# Patient Record
Sex: Female | Born: 1960 | Race: White | Hispanic: No | Marital: Married | Smoking: Former smoker
Health system: Southern US, Community
[De-identification: ages and names within clinical notes are randomized; demographics above are authoritative.]

## PROBLEM LIST (undated history)

## (undated) DIAGNOSIS — C801 Malignant (primary) neoplasm, unspecified: Secondary | ICD-10-CM

## (undated) DIAGNOSIS — F329 Major depressive disorder, single episode, unspecified: Secondary | ICD-10-CM

## (undated) DIAGNOSIS — K219 Gastro-esophageal reflux disease without esophagitis: Secondary | ICD-10-CM

## (undated) DIAGNOSIS — R030 Elevated blood-pressure reading, without diagnosis of hypertension: Secondary | ICD-10-CM

## (undated) DIAGNOSIS — I82409 Acute embolism and thrombosis of unspecified deep veins of unspecified lower extremity: Secondary | ICD-10-CM

## (undated) DIAGNOSIS — F32A Depression, unspecified: Secondary | ICD-10-CM

## (undated) DIAGNOSIS — I2699 Other pulmonary embolism without acute cor pulmonale: Secondary | ICD-10-CM

## (undated) DIAGNOSIS — Z8614 Personal history of Methicillin resistant Staphylococcus aureus infection: Secondary | ICD-10-CM

## (undated) DIAGNOSIS — C569 Malignant neoplasm of unspecified ovary: Secondary | ICD-10-CM

## (undated) DIAGNOSIS — F419 Anxiety disorder, unspecified: Secondary | ICD-10-CM

## (undated) HISTORY — DX: Acute embolism and thrombosis of unspecified deep veins of unspecified lower extremity: I82.409

## (undated) HISTORY — DX: Depression, unspecified: F32.A

## (undated) HISTORY — DX: Other pulmonary embolism without acute cor pulmonale: I26.99

## (undated) HISTORY — DX: Elevated blood-pressure reading, without diagnosis of hypertension: R03.0

## (undated) HISTORY — PX: TONSILECTOMY/ADENOIDECTOMY WITH MYRINGOTOMY: SHX6125

## (undated) HISTORY — PX: CHOLECYSTECTOMY: SHX55

## (undated) HISTORY — DX: Malignant neoplasm of unspecified ovary: C56.9

## (undated) HISTORY — DX: Major depressive disorder, single episode, unspecified: F32.9

## (undated) HISTORY — DX: Anxiety disorder, unspecified: F41.9

---

## 2006-12-18 ENCOUNTER — Inpatient Hospital Stay: Payer: Self-pay | Admitting: Internal Medicine

## 2010-04-12 DIAGNOSIS — Z8614 Personal history of Methicillin resistant Staphylococcus aureus infection: Secondary | ICD-10-CM

## 2010-04-12 HISTORY — DX: Personal history of Methicillin resistant Staphylococcus aureus infection: Z86.14

## 2014-08-09 ENCOUNTER — Ambulatory Visit
Admit: 2014-08-09 | Disposition: A | Discharge status: Home / Self Care | Payer: Self-pay | Attending: Advanced Practice Midwife | Admitting: Advanced Practice Midwife

## 2016-01-30 DIAGNOSIS — F419 Anxiety disorder, unspecified: Secondary | ICD-10-CM | POA: Diagnosis not present

## 2016-01-30 DIAGNOSIS — N898 Other specified noninflammatory disorders of vagina: Secondary | ICD-10-CM | POA: Diagnosis not present

## 2016-01-30 DIAGNOSIS — F329 Major depressive disorder, single episode, unspecified: Secondary | ICD-10-CM | POA: Diagnosis not present

## 2016-01-30 DIAGNOSIS — Z23 Encounter for immunization: Secondary | ICD-10-CM | POA: Diagnosis not present

## 2017-03-01 ENCOUNTER — Ambulatory Visit: Payer: Self-pay | Admitting: Obstetrics and Gynecology

## 2017-03-08 ENCOUNTER — Encounter: Payer: Self-pay | Admitting: Obstetrics and Gynecology

## 2017-03-08 ENCOUNTER — Ambulatory Visit (INDEPENDENT_AMBULATORY_CARE_PROVIDER_SITE_OTHER): Payer: Federal, State, Local not specified - PPO | Admitting: Obstetrics and Gynecology

## 2017-03-08 ENCOUNTER — Other Ambulatory Visit: Payer: Self-pay

## 2017-03-08 VITALS — BP 154/100 | HR 100 | Ht 64.0 in | Wt 252.0 lb

## 2017-03-08 DIAGNOSIS — R030 Elevated blood-pressure reading, without diagnosis of hypertension: Secondary | ICD-10-CM

## 2017-03-08 DIAGNOSIS — Z23 Encounter for immunization: Secondary | ICD-10-CM

## 2017-03-08 DIAGNOSIS — R635 Abnormal weight gain: Secondary | ICD-10-CM

## 2017-03-08 DIAGNOSIS — F32A Depression, unspecified: Secondary | ICD-10-CM

## 2017-03-08 DIAGNOSIS — F419 Anxiety disorder, unspecified: Secondary | ICD-10-CM

## 2017-03-08 DIAGNOSIS — F329 Major depressive disorder, single episode, unspecified: Secondary | ICD-10-CM

## 2017-03-08 MED ORDER — SERTRALINE HCL 100 MG PO TABS: 150.0000 mg | ORAL_TABLET | Freq: Every day | ORAL | 0 refills | Status: DC

## 2017-03-08 MED ORDER — ALPRAZOLAM 0.5 MG PO TABS: 0.5000 mg | ORAL_TABLET | Freq: Every evening | ORAL | 0 refills | Status: DC | PRN

## 2017-03-08 NOTE — Progress Notes (Signed)
Chief Complaint  Patient presents with  . Follow-up    Zoloft Refill    HPI:      [name] is a 56 y.o. No obstetric history on file. who LMP was No LMP recorded (lmp unknown). Patient is not currently having periods (Reason: Perimenopausal)., presents today for Rx RF on zoloft for anxiety/depression. Pt had been followed by [facility], CNM. She has a long hx of off and on zoloft use for sx. Pt is taking zoloft 100 mg daily and xanax very rarely. She has a lot of family stressors and thinks the zoloft is helping with sx but not controlling them. She is irritable with a lot of crying and wanting to withdraw. No side effects of Rx. She has not seen a therapist. She is upset about her weight although admits to being a compulsive eater for emotional support. She does not exercise. She had been 128# at one point. She does have occas vasomotor sx and wonders how much of her sx are menopause related. She just wants to feel "herself" again.  She is terrified of new medications.    Past Medical History:  Diagnosis Date  . Anxiety and depression   . White coat syndrome without hypertension     Past Surgical History:  Procedure Laterality Date  . CESAREAN SECTION  12/87;8/92;10/97  . CHOLECYSTECTOMY    . TONSILECTOMY/ADENOIDECTOMY WITH MYRINGOTOMY      History reviewed. No pertinent family history.  Social History   Socioeconomic History  . Marital status: Married    Spouse name: Not on file  . Number of children: Not on file  . Years of education: Not on file  . Highest education level: Not on file  Social Needs  . Financial resource strain: Not on file  . Food insecurity - worry: Not on file  . Food insecurity - inability: Not on file  . Transportation needs - medical: Not on file  . Transportation needs - non-medical: Not on file  Occupational History  . Not on file  Tobacco Use  . Smoking status: Former Smoker    Packs/day: 0.50    Years: 18.00    Pack  years: 9.00    Types: Cigarettes  . Smokeless tobacco: Never Used  Substance and Sexual Activity  . Alcohol use: No    Frequency: Never  . Drug use: No  . Sexual activity: Not Currently  Other Topics Concern  . Not on file  Social History Narrative  . Not on file     Current Outpatient Medications:  .  ALPRAZolam (XANAX) 0.5 MG tablet, Take 1 tablet (0.5 mg total) by mouth at bedtime as needed for anxiety., Disp: 30 tablet, Rfl: 0 .  sertraline (ZOLOFT) 100 MG tablet, Take 1.5 tablets (150 mg total) by mouth daily., Disp: 135 tablet, Rfl: 0   ROS:  Review of Systems  Constitutional: Negative for fever.  Gastrointestinal: Negative for blood in stool, constipation, diarrhea, nausea and vomiting.  Genitourinary: Negative for dyspareunia, dysuria, flank pain, frequency, hematuria, urgency, vaginal bleeding, vaginal discharge and vaginal pain.  Musculoskeletal: Negative for back pain.  Skin: Negative for rash.  Psychiatric/Behavioral: Positive for agitation and dysphoric mood. Negative for self-injury and suicidal ideas. The patient is nervous/anxious.      OBJECTIVE:   Vitals:  BP (!) 154/100 (BP Location: Right Arm, Patient Position: Sitting, Cuff Size: Large)   Pulse 100   Ht 5\' 4"  (1.626 m)   Wt 252 lb (114.3 kg)  LMP  (LMP Unknown)   BMI 43.26 kg/m   Physical Exam  Constitutional: She is oriented to person, place, and time and well-developed, well-nourished, and in no distress.  Pulmonary/Chest: Effort normal.  Neurological: She is alert and oriented to person, place, and time.  Psychiatric: Memory, affect and judgment normal.  Vitals reviewed.   Results: GAD-7=15 (WAS 3 IN 10/17) PHQ-9=17 (WAS 8 IN 10/17)  Assessment/Plan: Anxiety and depression - Sx not controlled with zoloft 100 mg. Will increase to 150 mg (1 1/2 tabs daily). Strongly encouraged pt to see therapist. Names given. Sx most likely from past - Plan: sertraline (ZOLOFT) 100 MG tablet, ALPRAZolam  (XANAX) 0.5 MG tablet  Elevated blood pressure reading in office with white coat syndrome, without diagnosis of hypertension - Pt's daughter to rechk at home. May be early HTN. Rechk at 6 wk f/u appt too.  Weight gain - Discussed diet/exercise changes but if pt is compulsively eating, we need to deal with that first. Add exercise/see therapist first. Pt declines wt loss meds.  Need for immunization against influenza - Plan: Flu Vaccine QUAD 36+ mos IM    Meds ordered this encounter  Medications  . sertraline (ZOLOFT) 100 MG tablet    Sig: Take 1.5 tablets (150 mg total) by mouth daily.    Dispense:  135 tablet    Refill:  0  . ALPRAZolam (XANAX) 0.5 MG tablet    Sig: Take 1 tablet (0.5 mg total) by mouth at bedtime as needed for anxiety.    Dispense:  30 tablet    Refill:  0      Return in about 6 weeks (around 04/19/2017) for anxiety/depression f/u.  Alicia B. Copland, PA-C 03/09/2017 9:27 AM

## 2017-03-08 NOTE — Patient Instructions (Signed)
I value your feedback and entrusting us with your care. If you get a [facility] patient survey, I would appreciate you taking the time to let us know about your experience today. Thank you! 

## 2017-03-09 ENCOUNTER — Encounter: Payer: Self-pay | Admitting: Obstetrics and Gynecology

## 2017-04-19 ENCOUNTER — Ambulatory Visit: Payer: Federal, State, Local not specified - PPO | Admitting: Obstetrics and Gynecology

## 2017-05-09 ENCOUNTER — Ambulatory Visit: Payer: Federal, State, Local not specified - PPO | Admitting: Obstetrics and Gynecology

## 2017-05-17 ENCOUNTER — Ambulatory Visit (INDEPENDENT_AMBULATORY_CARE_PROVIDER_SITE_OTHER): Payer: Federal, State, Local not specified - PPO | Admitting: Obstetrics and Gynecology

## 2017-05-17 ENCOUNTER — Encounter: Payer: Self-pay | Admitting: Obstetrics and Gynecology

## 2017-05-17 VITALS — BP 150/98 | HR 96 | Ht 64.0 in | Wt 254.0 lb

## 2017-05-17 DIAGNOSIS — F419 Anxiety disorder, unspecified: Secondary | ICD-10-CM

## 2017-05-17 DIAGNOSIS — R030 Elevated blood-pressure reading, without diagnosis of hypertension: Secondary | ICD-10-CM | POA: Diagnosis not present

## 2017-05-17 DIAGNOSIS — Z713 Dietary counseling and surveillance: Secondary | ICD-10-CM | POA: Diagnosis not present

## 2017-05-17 DIAGNOSIS — F329 Major depressive disorder, single episode, unspecified: Secondary | ICD-10-CM

## 2017-05-17 DIAGNOSIS — F32A Depression, unspecified: Secondary | ICD-10-CM

## 2017-05-17 MED ORDER — SERTRALINE HCL 100 MG PO TABS: 100.0000 mg | ORAL_TABLET | Freq: Every day | ORAL | 2 refills | Status: DC

## 2017-05-17 NOTE — Progress Notes (Signed)
Chief Complaint  Patient presents with  . Anxiety  . Depression    HPI:      [name] is a 58 y.o. [phone] who LMP was No LMP recorded. Patient is not currently having periods (Reason: Perimenopausal)., presents today for anxiety/depression f/u from 03/08/17. Pt was having a lot of irritability with crying and wanting to withdraw when I saw her last. She was already on zoloft 100 mg daily but sx weren't fully controlled. She was also upset about wt gain but admitted to being a compulsive eater. She had not seen a therapist and was unhappy, wanting to feel "herself" again. We increased zoloft to 1 1/2 tabs (150 mg ) daily.   Pt states 150 mg dose zoloft made her too sleepy. She cut back to 100 mg daily but is feeling much better overall. Depression and anxiety sx improved. She is walking for exercise which is helping. She never saw therapist due to several fam issues. She is interested in diet/exercise changes for wt loss and has gotten some OTC supp, which contain caffeine for appetite suppression.   She had elevated BP when I saw her 11/18 and again today. She states her daughter takes her BP at home and they are in the 120s-130/70s-80. She was stressed this morning. She is not on BP meds.    Past Medical History:  Diagnosis Date  . Anxiety and depression   . White coat syndrome without hypertension     Past Surgical History:  Procedure Laterality Date  . CESAREAN SECTION  12/87;8/92;10/97  . CHOLECYSTECTOMY    . TONSILECTOMY/ADENOIDECTOMY WITH MYRINGOTOMY      Family History  Problem Relation Age of Onset  . Heart Problems Neg Hx   . Cholelithiasis Neg Hx     Social History   Socioeconomic History  . Marital status: Married    Spouse name: Not on file  . Number of children: Not on file  . Years of education: Not on file  . Highest education level: Not on file  Social Needs  . Financial resource strain: Not on file  . Food insecurity - worry: Not on file    . Food insecurity - inability: Not on file  . Transportation needs - medical: Not on file  . Transportation needs - non-medical: Not on file  Occupational History  . Not on file  Tobacco Use  . Smoking status: Former Smoker    Packs/day: 0.50    Years: 18.00    Pack years: 9.00    Types: Cigarettes  . Smokeless tobacco: Never Used  Substance and Sexual Activity  . Alcohol use: No    Frequency: Never  . Drug use: No  . Sexual activity: Not Currently  Other Topics Concern  . Not on file  Social History Narrative  . Not on file     Current Outpatient Medications:  .  sertraline (ZOLOFT) 100 MG tablet, Take 1 tablet (100 mg total) by mouth daily., Disp: 90 tablet, Rfl: 2 .  ALPRAZolam (XANAX) 0.5 MG tablet, Take 1 tablet (0.5 mg total) by mouth at bedtime as needed for anxiety., Disp: 30 tablet, Rfl: 0   ROS:  Review of Systems  Constitutional: Negative for fever.  Gastrointestinal: Negative for blood in stool, constipation, diarrhea, nausea and vomiting.  Genitourinary: Negative for dyspareunia, dysuria, flank pain, frequency, hematuria, urgency, vaginal bleeding, vaginal discharge and vaginal pain.  Musculoskeletal: Negative for back pain.  Skin: Negative for rash.  Psychiatric/Behavioral: Positive for  agitation and dysphoric mood. Negative for behavioral problems, confusion, hallucinations and suicidal ideas. The patient is not nervous/anxious.      OBJECTIVE:   Vitals:  BP (!) 150/98   Pulse 96   Ht 5\' 4"  (1.626 m)   Wt 254 lb (115.2 kg)   BMI 43.60 kg/m   Physical Exam  Constitutional: She is oriented to person, place, and time and well-developed, well-nourished, and in no distress.  Neurological: She is alert and oriented to person, place, and time.  Psychiatric: Memory, affect and judgment normal.  Vitals reviewed.   Results: GAD-7=5 (was 15 11/18) PHQ-9=3-4 (was 17 11/18)  Assessment/Plan: Anxiety and depression - Sx improved, even though still on  same zoloft 100 mg dose. Cont Rx. Cont exercise. F/u prn. Takes xanax sparingly and has Rx on file.  - Plan: sertraline (ZOLOFT) 100 MG tablet  Weight loss counseling, encounter for - Cont exercise/My[employer]itnessPal vs wt watchers. Must do food habit changes and not rely only on appetite supp supplements. Watch caffeine dose.   Elevated blood pressure reading in office with white coat syndrome, without diagnosis of hypertension - Pt's daughter to cont to take BP. Watch caffeine use and BP. If 140/90, pt needs to f/u for BP mgmt. Pt understands.    Meds ordered this encounter  Medications  . sertraline (ZOLOFT) 100 MG tablet    Sig: Take 1 tablet (100 mg total) by mouth daily.    Dispense:  90 tablet    Refill:  2      Return if symptoms worsen or fail to improve.  Alicia B. Copland, PA-C 05/17/2017 10:10 AM

## 2017-05-17 NOTE — Patient Instructions (Signed)
I value your feedback and entrusting us with your care. If you get a [facility] patient survey, I would appreciate you taking the time to let us know about your experience today. Thank you! 

## 2017-07-11 DIAGNOSIS — C569 Malignant neoplasm of unspecified ovary: Secondary | ICD-10-CM

## 2017-07-11 HISTORY — DX: Malignant neoplasm of unspecified ovary: C56.9

## 2017-07-18 ENCOUNTER — Other Ambulatory Visit (INDEPENDENT_AMBULATORY_CARE_PROVIDER_SITE_OTHER): Payer: Federal, State, Local not specified - PPO

## 2017-07-18 ENCOUNTER — Encounter: Payer: Self-pay | Admitting: Obstetrics and Gynecology

## 2017-07-18 ENCOUNTER — Ambulatory Visit: Payer: Federal, State, Local not specified - PPO | Admitting: Obstetrics and Gynecology

## 2017-07-18 ENCOUNTER — Telehealth: Payer: Self-pay

## 2017-07-18 ENCOUNTER — Other Ambulatory Visit: Payer: Self-pay

## 2017-07-18 VITALS — BP 155/100 | HR 88 | Temp 97.9°F | Ht 64.0 in | Wt 256.0 lb

## 2017-07-18 DIAGNOSIS — R102 Pelvic and perineal pain: Secondary | ICD-10-CM | POA: Diagnosis not present

## 2017-07-18 DIAGNOSIS — R300 Dysuria: Secondary | ICD-10-CM | POA: Diagnosis not present

## 2017-07-18 DIAGNOSIS — N898 Other specified noninflammatory disorders of vagina: Secondary | ICD-10-CM | POA: Diagnosis not present

## 2017-07-18 DIAGNOSIS — N838 Other noninflammatory disorders of ovary, fallopian tube and broad ligament: Secondary | ICD-10-CM | POA: Diagnosis not present

## 2017-07-18 LAB — POCT WET PREP WITH KOH
Clue Cells Wet Prep HPF POC: NEGATIVE
KOH Prep POC: NEGATIVE
TRICHOMONAS UA: NEGATIVE
YEAST WET PREP PER HPF POC: NEGATIVE

## 2017-07-18 LAB — POCT URINALYSIS DIPSTICK
Bilirubin, UA: NEGATIVE
Blood, UA: NEGATIVE
Glucose, UA: NEGATIVE
Ketones, UA: NEGATIVE
Leukocytes, UA: NEGATIVE
NITRITE UA: NEGATIVE
ODOR: NORMAL
PH UA: 6 (ref 5.0–8.0)
Protein, UA: NEGATIVE
Spec Grav, UA: 1.02 (ref 1.010–1.025)
UROBILINOGEN UA: 0.2 U/dL

## 2017-07-18 NOTE — Patient Instructions (Signed)
I value your feedback and entrusting us with your care. If you get a  patient survey, I would appreciate you taking the time to let us know about your experience today. Thank you! 

## 2017-07-18 NOTE — Progress Notes (Addendum)
Rose Billet, MD   Chief Complaint  Patient presents with  . Gynecologic Exam    pelvic pressure/dysuria, lbpx 2wks/discharge x1wk    HPI:      [name] is a 57 y.o. [phone] who LMP was No LMP recorded. (Menstrual status: Perimenopausal)., presents today for pelvic pressure/pain for the past 2 wks. Just doesn't feel well. Has increased pressure with urinating and feels like she has to "sit forward" to fully empty her bladder. No dysuria, frequency. Has had frequent loose to formed stools recently. S/p lap chole so "never knows what she is going to get" with BMs. Pt has also had increased vag d/c for the past 4-5 days and always has a vag itch. Uses safeguard soap and dryer sheets. No change in soaps/detergents. She is not sex active. Has noticed low back pain, no fevers. No recent abx use. Hx of leios in past (per Dr. Davis Gourd, question size, but haven't been a source of sx in past).   Past Medical History:  Diagnosis Date  . Anxiety and depression   . White coat syndrome without hypertension     Past Surgical History:  Procedure Laterality Date  . CESAREAN SECTION  12/87;8/92;10/97  . CHOLECYSTECTOMY    . TONSILECTOMY/ADENOIDECTOMY WITH MYRINGOTOMY      Family History  Problem Relation Age of Onset  . Heart Problems Neg Hx   . Cholelithiasis Neg Hx     Social History   Socioeconomic History  . Marital status: Married    Spouse name: Not on file  . Number of children: Not on file  . Years of education: Not on file  . Highest education level: Not on file  Occupational History  . Not on file  Social Needs  . Financial resource strain: Not on file  . Food insecurity:    Worry: Not on file    Inability: Not on file  . Transportation needs:    Medical: Not on file    Non-medical: Not on file  Tobacco Use  . Smoking status: Former Smoker    Packs/day: 0.50    Years: 18.00    Pack years: 9.00    Types: Cigarettes  . Smokeless tobacco: Never Used    Substance and Sexual Activity  . Alcohol use: No    Frequency: Never  . Drug use: No  . Sexual activity: Not Currently  Lifestyle  . Physical activity:    Days per week: Not on file    Minutes per session: Not on file  . Stress: Not on file  Relationships  . Social connections:    Talks on phone: Not on file    Gets together: Not on file    Attends religious service: Not on file    Active member of club or organization: Not on file    Attends meetings of clubs or organizations: Not on file    Relationship status: Not on file  . Intimate partner violence:    Fear of current or ex partner: Not on file    Emotionally abused: Not on file    Physically abused: Not on file    Forced sexual activity: Not on file  Other Topics Concern  . Not on file  Social History Narrative  . Not on file    Outpatient Medications Prior to Visit  Medication Sig Dispense Refill  . ALPRAZolam (XANAX) 0.5 MG tablet Take 1 tablet (0.5 mg total) by mouth at bedtime as needed for anxiety.  30 tablet 0  . sertraline (ZOLOFT) 100 MG tablet Take 1 tablet (100 mg total) by mouth daily. 90 tablet 2   No facility-administered medications prior to visit.     ROS:  Review of Systems  Constitutional: Negative for fatigue, fever and unexpected weight change.  Respiratory: Negative for cough, shortness of breath and wheezing.   Cardiovascular: Negative for chest pain, palpitations and leg swelling.  Gastrointestinal: Positive for constipation and diarrhea. Negative for blood in stool, nausea and vomiting.  Endocrine: Negative for cold intolerance, heat intolerance and polyuria.  Genitourinary: Positive for difficulty urinating, pelvic pain and vaginal discharge. Negative for dyspareunia, dysuria, flank pain, frequency, genital sores, hematuria, menstrual problem, urgency, vaginal bleeding and vaginal pain.  Musculoskeletal: Positive for back pain. Negative for joint swelling and myalgias.  Skin: Negative for  rash.  Neurological: Negative for dizziness, syncope, light-headedness, numbness and headaches.  Hematological: Negative for adenopathy.  Psychiatric/Behavioral: Negative for agitation, confusion, sleep disturbance and suicidal ideas. The patient is not nervous/anxious.    BREAST: No symptoms   OBJECTIVE:   Vitals:  BP (!) 155/100 (BP Location: Left Arm, Patient Position: Standing, Cuff Size: Large)   Pulse 88   Temp 97.9 F (36.6 C)   Ht 5\' 4"  (1.626 m)   Wt 256 lb (116.1 kg)   BMI 43.94 kg/m   Physical Exam  Constitutional: She is oriented to person, place, and time. Vital signs are normal. She appears well-developed.  Pulmonary/Chest: Effort normal.  Abdominal: Soft. Normal appearance. There is generalized tenderness. There is no rigidity and no guarding.  Genitourinary: There is no rash, tenderness, lesion or injury on the right labia. There is no rash, tenderness, lesion or injury on the left labia. Uterus is enlarged and tender. Cervix exhibits no motion tenderness. Right adnexum displays tenderness. Right adnexum displays no mass. Left adnexum displays tenderness. Left adnexum displays no mass. No erythema or tenderness in the vagina. Vaginal discharge found.  Genitourinary Comments: GYN EXAM DIFFICULT DUE TO TENDERNESS AND BODY HABITUS  Musculoskeletal: Normal range of motion.  Neurological: She is alert and oriented to person, place, and time.  Psychiatric: She has a normal mood and affect. Her behavior is normal. Thought content normal.    Results: Results for orders placed or performed in visit on 07/18/17 (from the past 24 hour(s))  POCT Urinalysis Dipstick     Status: Normal   Collection Time: 07/18/17 10:51 AM  Result Value Ref Range   Color, UA amber    Clarity, UA clear    Glucose, UA Neg    Bilirubin, UA Neg    Ketones, UA Neg    Spec Grav, UA 1.020 1.010 - 1.025   Blood, UA Neg    pH, UA 6.0 5.0 - 8.0   Protein, UA Neg    Urobilinogen, UA 0.2 0.2 or 1.0  E.U./dL   Nitrite, UA Neg    Leukocytes, UA Negative Negative   Appearance     Odor Normal   POCT Wet Prep with KOH     Status: Normal   Collection Time: 07/18/17 11:17 AM  Result Value Ref Range   Trichomonas, UA Negative    Clue Cells Wet Prep HPF POC neg    Epithelial Wet Prep HPF POC  Few, Moderate, Many, Too numerous to count   Yeast Wet Prep HPF POC neg    Bacteria Wet Prep HPF POC  Few   RBC Wet Prep HPF POC     WBC Wet Prep HPF  POC     KOH Prep POC Negative Negative   ULTRASOUND REPORT  Location: [facility]  Date of Service: 07/18/2017    Indications:Pelvic Pain Findings:  The uterus is enlarged,  anteverted and measures 12.15 x 9.08 x 7.86. Echo texture is heterogenous with evidence of focal masses. Within the uterus is a suspected fibroid measuring: Fibroid 1:4.69 x 3.36cm  The Endometrium measures 3.61 mm.  Right Ovary  It is not visualized due to enlarged uterus Left Ovary measures 7.62 x 5.18 x 4.90 cm. It is not normal in appearance and is very enlarged. Survey of the adnexa demonstrates no adnexal masses. There is a small amount free fluid in the cul de sac.  Impression: 1. Enlarged uterus with fibroid 2. Enlarged left ovary vs other process  Recommendations: 1.Clinical correlation with the patient's History and Physical Exam.   Edwena Bunde, RDMS, RVT  Assessment/Plan: Pelvic pressure in female - Neg dip, tender on exam. U/S with large internal leio and 7 cm enlarged LT ovary. Hard to differentiate images on u/s. Check MRI/ca-125 per Dr. Glennon Mac.  - Plan: US PELVIS TRANSVANGINAL NON-OB (TV ONLY)  Pelvic pain - Plan: Urine Culture, US PELVIS TRANSVANGINAL NON-OB (TV ONLY)  Pelvic and perineal pain - Plan: MR PELVIS W WO CONTRAST, CANCELED: MR MRV PELVIS W WO CONTRAST  Enlarged ovary - Plan: CA 125, MR PELVIS W WO CONTRAST, CANCELED: MR MRV PELVIS W WO CONTRAST  Dysuria - Neg dip. Check C&S. Will f/u with results. - Plan: POCT  Urinalysis Dipstick, Urine Culture  Vaginal discharge - Neg wet prep. Reassurance. Dove sens skin soap/line dry underwear. - Plan: POCT Wet Prep with KOH    Return if symptoms worsen or fail to improve.  Alicia B. Copland, PA-C 07/19/2017 9:31 AM

## 2017-07-18 NOTE — Telephone Encounter (Signed)
Done. Pt aware of GYN u/s results. Sched MRI/ca-125.

## 2017-07-18 NOTE — Telephone Encounter (Signed)
Pt called stating someone from [facility] just called her and she would like to know the results of her u/s

## 2017-07-19 NOTE — Addendum Note (Signed)
Addended by: Ardeth Perfect B on: 08/12/7480 09:31 AM   Modules accepted: Orders

## 2017-07-20 ENCOUNTER — Other Ambulatory Visit: Payer: Federal, State, Local not specified - PPO

## 2017-07-20 DIAGNOSIS — N838 Other noninflammatory disorders of ovary, fallopian tube and broad ligament: Secondary | ICD-10-CM

## 2017-07-20 LAB — URINE CULTURE

## 2017-07-21 ENCOUNTER — Telehealth: Payer: Self-pay | Admitting: Obstetrics and Gynecology

## 2017-07-21 DIAGNOSIS — R971 Elevated cancer antigen 125 [CA 125]: Secondary | ICD-10-CM

## 2017-07-21 DIAGNOSIS — N838 Other noninflammatory disorders of ovary, fallopian tube and broad ligament: Secondary | ICD-10-CM

## 2017-07-21 DIAGNOSIS — R102 Pelvic and perineal pain unspecified side: Secondary | ICD-10-CM

## 2017-07-21 LAB — CA 125: Cancer Antigen (CA) 125: 8603 U/mL — ABNORMAL HIGH (ref 0.0–38.1)

## 2017-07-21 NOTE — Telephone Encounter (Signed)
Pt with ovarian mass and elevated ca-125. Refer to Gyn onc for further eval/mgmt. Pt aware.

## 2017-07-25 ENCOUNTER — Other Ambulatory Visit: Payer: Self-pay | Admitting: Obstetrics and Gynecology

## 2017-07-25 DIAGNOSIS — F419 Anxiety disorder, unspecified: Principal | ICD-10-CM

## 2017-07-25 DIAGNOSIS — F32A Depression, unspecified: Secondary | ICD-10-CM

## 2017-07-25 DIAGNOSIS — F329 Major depressive disorder, single episode, unspecified: Secondary | ICD-10-CM

## 2017-07-25 NOTE — Telephone Encounter (Signed)
Please advise for refill. Thank you.  

## 2017-07-26 ENCOUNTER — Other Ambulatory Visit: Payer: Self-pay | Admitting: Obstetrics and Gynecology

## 2017-07-26 ENCOUNTER — Ambulatory Visit (HOSPITAL_COMMUNITY)
Admission: RE | Admit: 2017-07-26 | Discharge: 2017-07-26 | Disposition: A | Discharge status: Home / Self Care | Payer: Federal, State, Local not specified - PPO | Source: Ambulatory Visit | Attending: Obstetrics and Gynecology | Admitting: Obstetrics and Gynecology

## 2017-07-26 ENCOUNTER — Telehealth: Payer: Self-pay | Admitting: Obstetrics and Gynecology

## 2017-07-26 DIAGNOSIS — R102 Pelvic and perineal pain: Secondary | ICD-10-CM | POA: Insufficient documentation

## 2017-07-26 DIAGNOSIS — D251 Intramural leiomyoma of uterus: Secondary | ICD-10-CM | POA: Diagnosis not present

## 2017-07-26 DIAGNOSIS — F329 Major depressive disorder, single episode, unspecified: Secondary | ICD-10-CM

## 2017-07-26 DIAGNOSIS — F32A Depression, unspecified: Secondary | ICD-10-CM

## 2017-07-26 DIAGNOSIS — N838 Other noninflammatory disorders of ovary, fallopian tube and broad ligament: Secondary | ICD-10-CM | POA: Insufficient documentation

## 2017-07-26 DIAGNOSIS — F419 Anxiety disorder, unspecified: Principal | ICD-10-CM

## 2017-07-26 DIAGNOSIS — R188 Other ascites: Secondary | ICD-10-CM | POA: Diagnosis not present

## 2017-07-26 MED ORDER — GADOBENATE DIMEGLUMINE 529 MG/ML IV SOLN
20.0000 mL | Freq: Once | INTRAVENOUS | Status: AC | PRN
  Administered 2017-07-26: 20 mL via INTRAVENOUS

## 2017-07-26 NOTE — Telephone Encounter (Signed)
Patient is calling needing an refill on her xanax 0.5 mg. [facility] on vacation please advise.

## 2017-07-26 NOTE — Progress Notes (Signed)
Pt was extremely claustrophobic before and during MRI.  Pt took xanax before exam.  Pt given contrast and did fine initially.  Around 10 mins after scan while patient was getting dressed, pt began feeling flushed in the chest and face area and her back began to itch.  Dr. Earleen Newport came and evaluated patient and determined she was fine to leave hospital because symptoms had resolved.  BP was 137/94 and heart rate was 118.

## 2017-07-26 NOTE — Telephone Encounter (Signed)
Message sent to [facility] nurse, Marolyn Hammock

## 2017-07-26 NOTE — Telephone Encounter (Signed)
Needs hard copy Rx to fax. I can't do remotely. Will do when I return. Thx.

## 2017-07-26 NOTE — Telephone Encounter (Signed)
Thank you :)

## 2017-07-26 NOTE — Telephone Encounter (Signed)
Actually, can you see if MD can do it? Has gyn onc appt tom for poss ovar ca. Prob needs sooner than I can do it. Thx.

## 2017-07-27 ENCOUNTER — Other Ambulatory Visit: Payer: Self-pay

## 2017-07-27 ENCOUNTER — Encounter: Payer: Self-pay | Admitting: Obstetrics and Gynecology

## 2017-07-27 ENCOUNTER — Other Ambulatory Visit: Payer: Self-pay | Admitting: Obstetrics and Gynecology

## 2017-07-27 ENCOUNTER — Inpatient Hospital Stay: Payer: Federal, State, Local not specified - PPO

## 2017-07-27 ENCOUNTER — Inpatient Hospital Stay
Payer: Federal, State, Local not specified - PPO | Attending: Obstetrics and Gynecology | Admitting: Obstetrics and Gynecology

## 2017-07-27 VITALS — BP 163/109 | HR 98 | Temp 98.0°F | Resp 18 | Ht 64.0 in | Wt 258.2 lb

## 2017-07-27 DIAGNOSIS — R1900 Intra-abdominal and pelvic swelling, mass and lump, unspecified site: Secondary | ICD-10-CM | POA: Insufficient documentation

## 2017-07-27 DIAGNOSIS — R6881 Early satiety: Secondary | ICD-10-CM | POA: Diagnosis not present

## 2017-07-27 DIAGNOSIS — M545 Low back pain: Secondary | ICD-10-CM | POA: Diagnosis not present

## 2017-07-27 DIAGNOSIS — N838 Other noninflammatory disorders of ovary, fallopian tube and broad ligament: Secondary | ICD-10-CM

## 2017-07-27 DIAGNOSIS — Z87891 Personal history of nicotine dependence: Secondary | ICD-10-CM | POA: Insufficient documentation

## 2017-07-27 DIAGNOSIS — R971 Elevated cancer antigen 125 [CA 125]: Secondary | ICD-10-CM | POA: Diagnosis not present

## 2017-07-27 LAB — COMPREHENSIVE METABOLIC PANEL
ALK PHOS: 51 U/L (ref 38–126)
ALT: 46 U/L (ref 14–54)
ANION GAP: 11 (ref 5–15)
AST: 48 U/L — ABNORMAL HIGH (ref 15–41)
Albumin: 3.9 g/dL (ref 3.5–5.0)
BUN: 11 mg/dL (ref 6–20)
CO2: 22 mmol/L (ref 22–32)
Calcium: 9.1 mg/dL (ref 8.9–10.3)
Chloride: 102 mmol/L (ref 101–111)
Creatinine, Ser: 0.63 mg/dL (ref 0.44–1.00)
GFR calc Af Amer: >60 mL/min (ref >60)
GFR calc non Af Amer: >60 mL/min (ref >60)
Glucose, Bld: 114 mg/dL — ABNORMAL HIGH (ref 65–99)
POTASSIUM: 3.7 mmol/L (ref 3.5–5.1)
SODIUM: 135 mmol/L (ref 135–145)
TOTAL PROTEIN: 7.4 g/dL (ref 6.5–8.1)
Total Bilirubin: 0.9 mg/dL (ref 0.3–1.2)

## 2017-07-27 LAB — CBC WITH DIFFERENTIAL/PLATELET
BASOS ABS: 0.1 10*3/uL (ref 0–0.1)
Basophils Relative: 1 %
EOS ABS: 0 10*3/uL (ref 0–0.7)
EOS PCT: 1 %
HCT: 43.2 % (ref 35.0–47.0)
Hemoglobin: 15 g/dL (ref 12.0–16.0)
Lymphocytes Relative: 14 %
Lymphs Abs: 1.1 10*3/uL (ref 1.0–3.6)
MCH: 29.7 pg (ref 26.0–34.0)
MCHC: 34.8 g/dL (ref 32.0–36.0)
MCV: 85.4 fL (ref 80.0–100.0)
MONO ABS: 0.7 10*3/uL (ref 0.2–0.9)
Monocytes Relative: 9 %
Neutro Abs: 5.8 10*3/uL (ref 1.4–6.5)
Neutrophils Relative %: 75 %
PLATELETS: 371 10*3/uL (ref 150–440)
RBC: 5.06 MIL/uL (ref 3.80–5.20)
RDW: 14 % (ref 11.5–14.5)
WBC: 7.7 10*3/uL (ref 3.6–11.0)

## 2017-07-27 NOTE — Progress Notes (Signed)
Gynecologic Oncology Consult Visit   Referring Provider:  Fraser Din, PA  Chief Complaint: pelvic mass and elevated CA125  Subjective:  Rose Anderson is a 57 y.o., G3P3003, female who is seen in consultation from Rose Anderson for pelvic mass and elevated CA125.   Initially patient presented to [facility], Rose Perfect, PA/Rose Anderson complaining of pelvic pain for 2 weeks and malaise with increased vaginal discharge.  Patient had previously seen Dr. Davis Anderson and per patient has past history of leios of questionable size but d/t asymptomatic nature, opted to avoid surgery.    07/18/2017- US PELVIS TRANSVAGINAL  Uterus is enlarged, anteverted, and measures 12.15 X 9.08 X 7.86.  Echotexture is heterogeneous with evidence of focal masses.  Within the uterus is a suspected fibroid measuring 1:4.69 X 3.36 CM Endometrium measures 3.61 mm. Right ovary-not visualized due to enlarged uterus Left ovary-7.62 X 5.18 X 4.90 cm.  It is not normal in appearance and is very enlarged. Survey of the adnexa demonstrates no adnexal masses. There is a small amount of free fluid in the cul-de-sac.  07/20/2017-  CA 125 = 8,603.0  07/26/2017- MRI Pelvis W WO CONTRAST - 7 cm intramural fibroid in right anterior corpus.  - 8.5 cm predominately solid left adnexal mass, which is contiguous with the left posterior uterine wall. Differential diagnosis: includes pedunculated fibroid with areas of cystic degeneration, and ovarian carcinoma. Recommend correlation with tumor markers. - Small amount of pelvic ascites. Mild peritoneal thickening and enhancement superior to the urinary bladder. Peritoneal carcinoma cannot be excluded. - Single 12 mm left external iliac lymph node.  Today, patient reports continued abdominal/pelvic pain. Reports feeling a 'fullness' in her pelvic region. She reports early satiety and left low back pain.    Problem List: Patient Active Problem List   Diagnosis Date Noted  .  Elevated blood pressure reading in office with white coat syndrome, without diagnosis of hypertension 05/17/2017   Past Medical History: Past Medical History:  Diagnosis Date  . Anxiety and depression   . White coat syndrome without hypertension    Past Surgical History: Past Surgical History:  Procedure Laterality Date  . CESAREAN SECTION  12/87;8/92;10/97  . CHOLECYSTECTOMY    . CHOLECYSTECTOMY    . TONSILECTOMY/ADENOIDECTOMY WITH MYRINGOTOMY     Past Gynecologic History:  [id number].  Age of menarche: 11 Periods last 7 days and cycle is normally 28 days She has a past history of genital warts.  Contraception: surgical PAP: she does not know when she has had her last Pap; no h/o abnormal Paps Mammogram: per patient last 2016    OB History:  OB History  Gravida Para Term Preterm AB Living  3 3 3     3   SAB TAB Ectopic Multiple Live Births          3    # Outcome Date GA Lbr Len/2nd Weight Sex Delivery Anes PTL Lv  3 Term           2 Term           1 Term            Family History: Family History  Problem Relation Age of Onset  . Colon cancer Maternal Grandfather   . Lung cancer Paternal Grandfather   . Heart Problems Neg Hx   . Cholelithiasis Neg Hx    Social History: Social History   Socioeconomic History  . Marital status: Married    Spouse name: Not on file  .  Number of children: Not on file  . Years of education: Not on file  . Highest education level: Not on file  Occupational History  . Not on file  Social Needs  . Financial resource strain: Not on file  . Food insecurity:    Worry: Not on file    Inability: Not on file  . Transportation needs:    Medical: Not on file    Non-medical: Not on file  Tobacco Use  . Smoking status: Former Smoker    Packs/day: 0.50    Years: 18.00    Pack years: 9.00    Types: Cigarettes  . Smokeless tobacco: Never Used  Substance and Sexual Activity  . Alcohol use: No    Frequency: Never  . Drug use: No  .  Sexual activity: Not Currently  Lifestyle  . Physical activity:    Days per week: Not on file    Minutes per session: Not on file  . Stress: Not on file  Relationships  . Social connections:    Talks on phone: Not on file    Gets together: Not on file    Attends religious service: Not on file    Active member of club or organization: Not on file    Attends meetings of clubs or organizations: Not on file    Relationship status: Not on file  . Intimate partner violence:    Fear of current or ex partner: Not on file    Emotionally abused: Not on file    Physically abused: Not on file    Forced sexual activity: Not on file  Other Topics Concern  . Not on file  Social History Narrative  . Not on file   Allergies: Allergies  Allergen Reactions  . Codeine Other (See Comments)    Pt states she feels like she was coming out of her skin & thought she was dying  . Sulfa Antibiotics Rash   Current Medications: Current Outpatient Medications  Medication Sig Dispense Refill  . ALPRAZolam (XANAX) 0.5 MG tablet TAKE ONE TABLET BY MOUTH AT BEDTIME AS NEEDED FOR ANXIETY 20 tablet 0  . sertraline (ZOLOFT) 100 MG tablet Take 1 tablet (100 mg total) by mouth daily. 90 tablet 2   No current facility-administered medications for this visit.     Review of Systems General: weight gain Skin: no complaints. Past history of genital warts Eyes:no complaints HEENT: no complaints Breasts: negative for masses or lumps Pulmonary: no complaints Cardiac: no complaints Gastrointestinal: early satiety; difficulty with bowel movements; no BRBPR or hematochezia Genitourinary/Sexual: pelvic fullness/pain; pushing on bladder Ob/Gyn: no complaints Musculoskeletal: low back pain Hematology: no complaints Neurologic/Psych: anxiety  Objective:  Physical Examination:  BP (!) 163/109 (BP Location: Left Arm, Patient Position: Sitting)   Pulse 98   Temp 98 F (36.7 C) (Tympanic)   Resp 18   Ht 5\' 4"   (1.626 m)   Wt 258 lb 3.2 oz (117.1 kg)   SpO2 98%   BMI 44.32 kg/m     ECOG Performance Status: 0 - Asymptomatic  GENERAL: Patient is a well appearing female in no acute distress HEENT:  Sclerae anicteric.  Oropharynx clear and moist. No ulcerations or evidence of oropharyngeal candidiasis. Neck is supple.  NODES:  No cervical, supraclavicular, or axillary lymphadenopathy palpated.  LUNGS:  Clear to auscultation bilaterally.  No wheezes or rhonchi. HEART:  Regular rate and rhythm. No murmur appreciated. ABDOMEN:  Soft, nontender.  Positive, normoactive bowel sounds. No organomegaly palpated. Mass  not appreciate due to habitus.  MSK:  No focal spinal tenderness to palpation. Full range of motion bilaterally in the upper extremities. EXTREMITIES:  No peripheral edema.   SKIN:  Clear with no obvious rashes or skin changes. No nail dyscrasia. NEURO:  Nonfocal. Well oriented.  Appropriate affect.   Pelvic: Exam chaperoned by nurse EGBUS: no lesions Cervix: no lesions, nontender, mobile; Pap obtained Vagina: no lesions, no discharge or bleeding Uterus: nontender, unable to determine mobility or size based on habitus Adnexa: no palpable masses but limited exam Rectovaginal: confirmatory. No evidence of impingement on the rectum  Lab Review Labs on site today: CBC, CMP, CEA and CA125 ordered  Radiologic Imaging: I personally reviewed [place] and MRI. CT C/A/P ordered given concern for malignancy    Assessment:  VESTER TITSWORTH is a 57 y.o. female diagnosed with symptomatic leiomyoma and pelvic mass with elevated CA125 concerning for malignancy. Differential also includes leiomyoma, endometriosis, Meigs syndrome, and other benign etiologies as well as GI malignancy.   Medical co-morbidities complicating care: morbid obesity (BMI 44.3); prior laparotomy with 3 prior C-sections.  Plan:   Problem List Items Addressed This Visit    None    Visit Diagnoses    Ovarian mass    -  Primary       We discussed options for management and strongly recommended surgery. Plan for robotic TLH and BSO with minilaparotomy for removal of the mass and uterus within an EndoCatch bag. Possible need for staging biopsies, omentectomy, and lymph node dissection if intraoperative pathology reveals malignancy. Possible bowel resection and ostomy discussed, but suspect that there is mass effect from left ovarian mass rather than rectosigmoid involvement.   Risks were discussed in detail. These include infection, anesthesia, bleeding, transfusion, wound separation, medical issues (blood clots, stroke, heart attack, fluid in the lungs, pneumonia, abnormal heart rhythm, death), possible exploratory surgery with larger incision, injury to adjacent organs (bowel, bladder, ureters, blood vessels, and nerves), lymphedema, lymphocyst, and allergic reaction.   We ordered Pap stat and will reassess CA125 to determine if that prior result was lab error. CT scan C/A/P ordered to assess for metastatic disease. CBC, CMP, and CEA.  She does not like codeine for pain control. We discussed using Tylenol, NSAIDs, and alternative narcotics.   Suggested return to clinic in approximately 5 weeks.    The patient's diagnosis, an outline of the further diagnostic and laboratory studies which will be required, the recommendation for surgery, and alternatives were discussed with her and her accompanying family members.  All questions were answered to their satisfaction.  A total of 120 minutes were spent with the patient/family today; 30% was spent in education, counseling and coordination of care.    Rose Rutter, DNP, AGNP-C [facility] [place] (work cell) [phone] (office) 07/27/17 12:11 PM  I personally interviewed and examined the patient as well as reviewed history, labs, and radiologic imaging. Agreed with the above/below plan of care. Patient/family questions were answered.  Rose Glad, MD   CC:  Fraser Din, [place]

## 2017-07-27 NOTE — Patient Instructions (Signed)
Laparoscopy Laparoscopy is a procedure to diagnose diseases in the abdomen. During the procedure, a thin, lighted, pencil-sized instrument called a laparoscope is inserted into the abdomen through an incision. The laparoscope allows your health care provider to look at the organs inside your body. LET YOUR HEALTH CARE PROVIDER KNOW ABOUT:  Any allergies you have.  All medicines you are taking, including vitamins, herbs, eye drops, creams, and over-the-counter medicines.  Previous problems you or members of your family have had with the use of anesthetics.  Any blood disorders you have.  Previous surgeries you have had.  Medical conditions you have. RISKS AND COMPLICATIONS  Generally, this is a safe procedure. However, problems can occur, which may include:  Infection.  Bleeding.  Damage to other organs.  Allergic reaction to the anesthetics used during the procedure. BEFORE THE PROCEDURE  Do not eat or drink anything after midnight on the night before the procedure or as directed by your health care provider.  Ask your health care provider about: ? Changing or stopping your regular medicines. ? Taking medicines such as aspirin and ibuprofen. These medicines can thin your blood. Do not take these medicines before your procedure if your health care provider instructs you not to.  Plan to have someone take you home after the procedure. PROCEDURE  You may be given a medicine to help you relax (sedative).  You will be given a medicine to make you sleep (general anesthetic).  Your abdomen will be inflated with a gas. This will make your organs easier to see.  Small incisions will be made in your abdomen.  A laparoscope and other small instruments will be inserted into the abdomen through the incisions.  A tissue sample may be removed from an organ in the abdomen for examination.  The instruments will be removed from the abdomen.  The gas will be released.  The incisions  will be closed with stitches (sutures). AFTER THE PROCEDURE  Your blood pressure, heart rate, breathing rate, and blood oxygen level will be monitored often until the medicines you were given have worn off.   This information is not intended to replace advice given to you by your health care provider. Make sure you discuss any questions you have with your health care provider.     Clear Liquid Diet for GYN Oncology Patients Day Before Surgery The day before your scheduled surgery DO NOT EAT any solid foods.  We do want you to drink enough liquids, but NO MILK products.  We do not want you to be dehydrated.  Clear liquids are defined as no milk products and no pieces of any solid food. The following are all approved for you to drink the day before you surgery.  Chicken, Beef or Vegetable Broth (bouillon or consomm) - NO BROTH AFTER MIDNIGHT  Plain Jello  (no fruit)  Water  Strained lemonade or fruit punch  Gatorade (any flavor)  CLEAR Ensure or Boost Breeze  Fruit juices without pulp, such as apple, grape, or cranberry juice  Clear sodas - NO SODA AFTER MIDNIGHT  Ice Pops without bits of fruit or fruit pulp  Honey  Tea or coffee without milk or cream Any foods not on the above list should be avoided.                                                                                 DIVISION OF GYNECOLOGIC ONCOLOGY BOWEL PREP   The following instructions are extremely important to prepare for your surgery. Please follow them carefully   Step 1: Liquid Diet Instructions   The day before surgery, drink ONLY CLEAR LIQUIDS for breakfast, lunch, dinner and throughout the day.  Drink at least 64 oz of fluid.             CLEAR LIQUID EXAMPLES:             Beef, chicken or vegetable broth, sodas, coffee, tea (sugar, lemon             artificial sweeteners, honey are acceptable), juices (apple, grape, cranberry, any    mixture of clear juices). Kool-Aid, Gatorade, Jell-o (without  fruit), popsicles                          NO MILK, MILK PRODUCTS, NON-DAIRY CREAMERS    Step 2: Laxatives           The evening before surgery:   Time: around 5pm   Follow these instructions carefully.   Administer 1 Dulcolax suppository according to manufacturer instructions on the box. You will need to purchase this laxative at a pharmacy or grocery store.     Individual responses to laxatives vary; this prep may cause multiple bowel movements. It often works in 30 minutes and may take as long as 3 hours. Stay near an available bathroom.    It is important to stay hydrated. Ensure you are still drinking clear liquids.      IMPORTANT: FOR YOUR SAFETY, WE WILL HAVE TO CANCEL YOUR SURGERY IF YOU DO NOT FOLLOW THESE INSTRUCTIONS.    Do not eat anything after midnight (including gum or candy) prior to your surgery.  Avoid drinking carbonated beverages after midnight.  You can have clear liquids up until one hour before you arrive at the hospital. "Nothing by mouth" means no liquids, gum, candy, etc for one hour before your arrival time.    Bowel Symptoms After Surgery After gynecologic surgery, women often have temporary changes in bowel function (constipation and gas pain).  Following are tips to help prevent and treat common bowel problems.  It also tells you when to call the doctor.  This is important because some symptoms might be a sign of a more serious bowel problem such as obstruction (bowel blockage).  These problems are rare but can happen after gynecologic surgery.   Besides surgery, what can temporarily affect bowel function? 1. Dietary changes   2. Decreased physical activity   3.Antibiotics   4. Pain medication   How can I prevent constipation (three days or more without a stool)? 1. Include fiber in your diet: whole grains, raw or dried fruits & vegetables, prunes, prune/pear juiceDrink at least 8 glasses of liquid (preferably water) every day 2. Avoid: ? Gas forming  foods such as broccoli, beans, peas, salads, cabbage, sweet potatoes ? Greasy, fatty, or fried foods 3. Activity helps bowel function return to normal, walk around the house at least 3-4 times each day for 15 minutes or longer, if tolerated.  Rocking in a rocking chair is preferable to sitting still. 4. Stool softeners: these are not laxatives, but serve to soften the stool to avoid straining.  Take 2-4 times a day until normal bowel function returns         Examples: Colace or generic equivalent (Docusafe) 5. Bulk laxatives: provide a concentrated source of fiber.    They do not stimulate the bowel.  Take 1-2 times each day until normal bowel function return.              Examples: Citrucel, Metamucil, Fiberal, Fibercon   What can I take for "Gas Pains"? 1. Simethicone (Mylicon, Gas-X, Maalox-Gas, Mylanta-Gas) take 3-4 times a day 2. Maalox Regular - take 3-4 times a day 3. Mylanta Regular - take 3-4 times a day   What can I take if I become constipated? 1. Start with stool softeners and add additional laxatives below as needed to have a bowel movement every 1-2 days  2. Stool softeners 1-2 tablets, 2 times a day 3. Senakot 1-2 tablets, 1-2 times a day 4. Glycerin suppository can soften hard stool take once a day 5. Bisacodyl suppository once a day  6. Milk of Magnesia 30 mL 1-2 times a day 7. Fleets or tap water enema    What can I do for nausea?  1. Limit most solid foods for 24-48 hours 2. Continue eating small frequent amounts of liquids and/or bland soft foods ? Toast, crackers, cooked cereal (grits, cream of wheat, rice) 3. Benadryl: a mild anti-nausea medicine can be obtained without a prescription. May cause drowsiness, especially if taken with narcotic pain medicines 4. Contact provider for prescription nausea medication     What can I do, or take for diarrhea (more than five loose stools per day)? 1. Drink plenty of clear fluids to prevent dehydration 2. May take Kaopectate,  Pepto-Bismol, Immodium, or probiotics for 1-2 days 3. Annusol or Preparation-H can be helpful for hemorrhoids and irritated tissue around anus   When should I call the doctor?             CONSTIPATION:   Not relieved after three days following the above program VOMITING:  That contains blood, "coffee ground" material  More the three times/hour and unable to keep down nausea medication for more than eight hours  With dry mouth, dark or strong urine, feeling light-headed, dizzy, or confused  With severe abdominal pain or bloating for more than 24 hours DIARRHEA:  That continues for more then 24-48 hours despite treatment  That contains blood or tarry material  With dry mouth, dark or strong urine, feeling light~headed, dizzy, or confused FEVER:  101 F or higher along with nausea, vomiting, gas pain, diarrhea UNABLE TO:  Pass gas from rectum for more than 24 hours  Tolerate liquids by mouth for more than 24 hours     Laparoscopy, Care After Refer to this sheet in the next few weeks. These instructions provide you with information about caring for yourself after your procedure. Your health care provider may also give you more specific instructions. Your treatment has been planned according to current medical practices, but problems sometimes occur. Call your health care provider if you have any problems or questions after your procedure. WHAT TO EXPECT AFTER THE PROCEDURE After your procedure, it is common to have mild discomfort in the throat and abdomen. HOME CARE INSTRUCTIONS  Take over-the-counter and prescription medicines only as told by your health care provider.  Do not drive for 24 hours if you received a sedative.  Return to your normal activities as told by your health care provider.  Do not take baths, swim, or use a hot tub until your health care provider approves. You may shower.  Follow instructions from your health care provider about how to take care of  your incision. Make sure you: ? Wash   your hands with soap and water before you change your bandage (dressing). If soap and water are not available, use hand sanitizer. ? Change your dressing as told by your health care provider. ? Leave stitches (sutures), skin glue, or adhesive strips in place. These skin closures may need to stay in place for 2 weeks or longer. If adhesive strip edges start to loosen and curl up, you may trim the loose edges. Do not remove adhesive strips completely unless your health care provider tells you to do that.  Check your incision area every day for signs of infection. Check for: ? More redness, swelling, or pain. ? More fluid or blood. ? Warmth. ? Pus or a bad smell.  It is your responsibility to get the results of your procedure. Ask your health care provider or the department performing the procedure when your results will be ready. SEEK MEDICAL CARE IF:  There is new pain in your shoulders.  You feel light-headed or faint.  You are unable to pass gas or unable to have a bowel movement.  You feel nauseous or you vomit.  You develop a rash.  You have more redness, swelling, or pain around your incision.  You have more fluid or blood coming from your incision.  Your incision feels warm to the touch.  You have pus or a bad smell coming from your incision.  You have a fever or chills. SEEK IMMEDIATE MEDICAL CARE IF:  Your pain is getting worse.  You have ongoing vomiting.  The edges of your incision open up.  You have trouble breathing.  You have chest pain.   This information is not intended to replace advice given to you by your health care provider. Make sure you discuss any questions you have with your health care provider.   Laparoscopic Hysterectomy, Care After Refer to this sheet in the next few weeks. These instructions provide you with information on caring for yourself after your procedure. Your health care provider may also give  you more specific instructions. Your treatment has been planned according to current medical practices, but problems sometimes occur. Call your health care provider if you have any problems or questions after your procedure. What can I expect after the procedure?  Pain and bruising at the incision sites. You will be given pain medicine to control it.  Menopausal symptoms such as hot flashes, night sweats, and insomnia if your ovaries were removed.  Sore throat from the breathing tube that was inserted during surgery. Follow these instructions at home:  Only take over-the-counter or prescription medicines for pain, discomfort, or fever as directed by your health care provider.  Do not take aspirin. It can cause bleeding.  Do not drive when taking pain medicine.  Follow your health care provider's advice regarding diet, exercise, lifting, driving, and general activities.  Resume your usual diet as directed and allowed.  Get plenty of rest and sleep.  Do not douche, use tampons, or have sexual intercourse for at least 6 weeks, or until your health care provider gives you permission.  Change your bandages (dressings) as directed by your health care provider.  Monitor your temperature and notify your health care provider of a fever.  Take showers instead of baths for 2-3 weeks.  Do not drink alcohol until your health care provider gives you permission.  If you develop constipation, you may take a mild laxative with your health care provider's permission. Bran foods may help with constipation problems. Drinking enough fluids   to keep your urine clear or pale yellow may help as well.  Try to have someone home with you for 1-2 weeks to help around the house.  Keep all of your follow-up appointments as directed by your health care provider. Contact a health care provider if:  You have swelling, redness, or increasing pain around your incision sites.  You have pus coming from your  incision.  You notice a bad smell coming from your incision.  Your incision breaks open.  You feel dizzy or lightheaded.  You have pain or bleeding when you urinate.  You have persistent diarrhea.  You have persistent nausea and vomiting.  You have abnormal vaginal discharge.  You have a rash.  You have any type of abnormal reaction or develop an allergy to your medicine.  You have poor pain control with your prescribed medicine. Get help right away if:  You have chest pain or shortness of breath.  You have severe abdominal pain that is not relieved with pain medicine.  You have pain or swelling in your legs. This information is not intended to replace advice given to you by your health care provider. Make sure you discuss any questions you have with your health care provider. Document Released: 01/17/2013 Document Revised: 09/04/2015 Document Reviewed: 10/17/2012 [employer]vier Interactive Patient Education  2017 [employer]     

## 2017-07-27 NOTE — H&P (Signed)
Gynecologic Oncology Consult Visit   Referring Provider:  Fraser Din, PA  Chief Complaint: pelvic mass and elevated CA125  Subjective:  Rose Anderson is a 57 y.o., G3P3003, female who is seen in consultation from Ms. Copeland for pelvic mass and elevated CA125.   Initially patient presented to [facility], Ardeth Perfect, PA/Dr. Glennon Mac complaining of pelvic pain for 2 weeks and malaise with increased vaginal discharge.  Patient had previously seen Dr. Davis Gourd and per patient has past history of leios of questionable size but d/t asymptomatic nature, opted to avoid surgery.    07/18/2017- US PELVIS TRANSVAGINAL  Uterus is enlarged, anteverted, and measures 12.15 X 9.08 X 7.86.  Echotexture is heterogeneous with evidence of focal masses.  Within the uterus is a suspected fibroid measuring 1:4.69 X 3.36 CM Endometrium measures 3.61 mm. Right ovary-not visualized due to enlarged uterus Left ovary-7.62 X 5.18 X 4.90 cm.  It is not normal in appearance and is very enlarged. Survey of the adnexa demonstrates no adnexal masses. There is a small amount of free fluid in the cul-de-sac.  07/20/2017-  CA 125 = 8,603.0  07/26/2017- MRI Pelvis W WO CONTRAST - 7 cm intramural fibroid in right anterior corpus.  - 8.5 cm predominately solid left adnexal mass, which is contiguous with the left posterior uterine wall. Differential diagnosis: includes pedunculated fibroid with areas of cystic degeneration, and ovarian carcinoma. Recommend correlation with tumor markers. - Small amount of pelvic ascites. Mild peritoneal thickening and enhancement superior to the urinary bladder. Peritoneal carcinoma cannot be excluded. - Single 12 mm left external iliac lymph node.  Today, patient reports continued abdominal/pelvic pain. Reports feeling a 'fullness' in her pelvic region. She reports early satiety and left low back pain.    Problem List: Patient Active Problem List   Diagnosis Date Noted  .  Elevated blood pressure reading in office with white coat syndrome, without diagnosis of hypertension 05/17/2017   Past Medical History: Past Medical History:  Diagnosis Date  . Anxiety and depression   . White coat syndrome without hypertension    Past Surgical History: Past Surgical History:  Procedure Laterality Date  . CESAREAN SECTION  12/87;8/92;10/97  . CHOLECYSTECTOMY    . CHOLECYSTECTOMY    . TONSILECTOMY/ADENOIDECTOMY WITH MYRINGOTOMY     Past Gynecologic History:  [id number].  Age of menarche: 11 Periods last 7 days and cycle is normally 28 days She has a past history of genital warts.  Contraception: surgical PAP: she does not know when she has had her last Pap; no h/o abnormal Paps Mammogram: per patient last 2016    OB History:  OB History  Gravida Para Term Preterm AB Living  3 3 3     3   SAB TAB Ectopic Multiple Live Births          3    # Outcome Date GA Lbr Len/2nd Weight Sex Delivery Anes PTL Lv  3 Term           2 Term           1 Term            Family History: Family History  Problem Relation Age of Onset  . Colon cancer Maternal Grandfather   . Lung cancer Paternal Grandfather   . Heart Problems Neg Hx   . Cholelithiasis Neg Hx    Social History: Social History   Socioeconomic History  . Marital status: Married    Spouse name: Not on file  .  Number of children: Not on file  . Years of education: Not on file  . Highest education level: Not on file  Occupational History  . Not on file  Social Needs  . Financial resource strain: Not on file  . Food insecurity:    Worry: Not on file    Inability: Not on file  . Transportation needs:    Medical: Not on file    Non-medical: Not on file  Tobacco Use  . Smoking status: Former Smoker    Packs/day: 0.50    Years: 18.00    Pack years: 9.00    Types: Cigarettes  . Smokeless tobacco: Never Used  Substance and Sexual Activity  . Alcohol use: No    Frequency: Never  . Drug use: No  .  Sexual activity: Not Currently  Lifestyle  . Physical activity:    Days per week: Not on file    Minutes per session: Not on file  . Stress: Not on file  Relationships  . Social connections:    Talks on phone: Not on file    Gets together: Not on file    Attends religious service: Not on file    Active member of club or organization: Not on file    Attends meetings of clubs or organizations: Not on file    Relationship status: Not on file  . Intimate partner violence:    Fear of current or ex partner: Not on file    Emotionally abused: Not on file    Physically abused: Not on file    Forced sexual activity: Not on file  Other Topics Concern  . Not on file  Social History Narrative  . Not on file   Allergies: Allergies  Allergen Reactions  . Codeine Other (See Comments)    Pt states she feels like she was coming out of her skin & thought she was dying  . Sulfa Antibiotics Rash   Current Medications: Current Outpatient Medications  Medication Sig Dispense Refill  . ALPRAZolam (XANAX) 0.5 MG tablet TAKE ONE TABLET BY MOUTH AT BEDTIME AS NEEDED FOR ANXIETY 20 tablet 0  . sertraline (ZOLOFT) 100 MG tablet Take 1 tablet (100 mg total) by mouth daily. 90 tablet 2   No current facility-administered medications for this visit.     Review of Systems General: weight gain Skin: no complaints. Past history of genital warts Eyes:no complaints HEENT: no complaints Breasts: negative for masses or lumps Pulmonary: no complaints Cardiac: no complaints Gastrointestinal: early satiety; difficulty with bowel movements; no BRBPR or hematochezia Genitourinary/Sexual: pelvic fullness/pain; pushing on bladder Ob/Gyn: no complaints Musculoskeletal: low back pain Hematology: no complaints Neurologic/Psych: anxiety  Objective:  Physical Examination:  BP (!) 163/109 (BP Location: Left Arm, Patient Position: Sitting)   Pulse 98   Temp 98 F (36.7 C) (Tympanic)   Resp 18   Ht 5\' 4"   (1.626 m)   Wt 258 lb 3.2 oz (117.1 kg)   SpO2 98%   BMI 44.32 kg/m     ECOG Performance Status: 0 - Asymptomatic  GENERAL: Patient is a well appearing female in no acute distress HEENT:  Sclerae anicteric.  Oropharynx clear and moist. No ulcerations or evidence of oropharyngeal candidiasis. Neck is supple.  NODES:  No cervical, supraclavicular, or axillary lymphadenopathy palpated.  LUNGS:  Clear to auscultation bilaterally.  No wheezes or rhonchi. HEART:  Regular rate and rhythm. No murmur appreciated. ABDOMEN:  Soft, nontender.  Positive, normoactive bowel sounds. No organomegaly palpated. Mass  not appreciate due to habitus.  MSK:  No focal spinal tenderness to palpation. Full range of motion bilaterally in the upper extremities. EXTREMITIES:  No peripheral edema.   SKIN:  Clear with no obvious rashes or skin changes. No nail dyscrasia. NEURO:  Nonfocal. Well oriented.  Appropriate affect.   Pelvic: Exam chaperoned by nurse EGBUS: no lesions Cervix: no lesions, nontender, mobile; Pap obtained Vagina: no lesions, no discharge or bleeding Uterus: nontender, unable to determine mobility or size based on habitus Adnexa: no palpable masses but limited exam Rectovaginal: confirmatory. No evidence of impingement on the rectum  Lab Review Labs on site today: CBC, CMP, CEA and CA125 ordered  Radiologic Imaging: I personally reviewed [place] and MRI. CT C/A/P ordered given concern for malignancy    Assessment:  Rose Anderson is a 57 y.o. female diagnosed with symptomatic leiomyoma and pelvic mass with elevated CA125 concerning for malignancy. Differential also includes leiomyoma, endometriosis, Meigs syndrome, and other benign etiologies as well as GI malignancy.   Medical co-morbidities complicating care: morbid obesity (BMI 44.3); prior laparotomy with 3 prior C-sections.  Plan:   Problem List Items Addressed This Visit    None    Visit Diagnoses    Ovarian mass    -  Primary       We discussed options for management and strongly recommended surgery. Plan for robotic TLH and BSO with minilaparotomy for removal of the mass and uterus within an EndoCatch bag. Possible need for staging biopsies, omentectomy, and lymph node dissection if intraoperative pathology reveals malignancy. Possible bowel resection and ostomy discussed, but suspect that there is mass effect from left ovarian mass rather than rectosigmoid involvement.   Risks were discussed in detail. These include infection, anesthesia, bleeding, transfusion, wound separation, medical issues (blood clots, stroke, heart attack, fluid in the lungs, pneumonia, abnormal heart rhythm, death), possible exploratory surgery with larger incision, injury to adjacent organs (bowel, bladder, ureters, blood vessels, and nerves), lymphedema, lymphocyst, and allergic reaction.   We ordered Pap stat and will reassess CA125 to determine if that prior result was lab error. CT scan C/A/P ordered to assess for metastatic disease. CBC, CMP, and CEA.    She is allergic to codeine. We discussed using NSAIDs, Tylenol, and other narcotics.  Suggested return to clinic in approximately 5 weeks.    The patient's diagnosis, an outline of the further diagnostic and laboratory studies which will be required, the recommendation for surgery, and alternatives were discussed with her and her accompanying family members.  All questions were answered to their satisfaction.  A total of 120 minutes were spent with the patient/family today; 30% was spent in education, counseling and coordination of care.  Beckey Rutter, DNP, AGNP-C [facility] (work cell) [phone] (office) 07/27/17 12:11 PM  I personally interviewed and examined the patient as well as reviewed history, labs, and radiologic imaging. Agreed with the above/below plan of care. Patient/family questions were answered.  Santiago Glad, MD   CC:   Fraser Din, [place]

## 2017-07-28 ENCOUNTER — Telehealth: Payer: Self-pay | Admitting: Nurse Practitioner

## 2017-07-28 ENCOUNTER — Telehealth: Payer: Self-pay | Admitting: *Deleted

## 2017-07-28 ENCOUNTER — Ambulatory Visit
Admission: RE | Admit: 2017-07-28 | Discharge: 2017-07-28 | Disposition: A | Discharge status: Home / Self Care | Payer: Federal, State, Local not specified - PPO | Source: Ambulatory Visit | Attending: Nurse Practitioner | Admitting: Nurse Practitioner

## 2017-07-28 ENCOUNTER — Other Ambulatory Visit: Payer: Self-pay | Admitting: Nurse Practitioner

## 2017-07-28 DIAGNOSIS — R590 Localized enlarged lymph nodes: Secondary | ICD-10-CM | POA: Insufficient documentation

## 2017-07-28 DIAGNOSIS — R188 Other ascites: Secondary | ICD-10-CM | POA: Diagnosis not present

## 2017-07-28 DIAGNOSIS — N839 Noninflammatory disorder of ovary, fallopian tube and broad ligament, unspecified: Secondary | ICD-10-CM | POA: Diagnosis not present

## 2017-07-28 DIAGNOSIS — R918 Other nonspecific abnormal finding of lung field: Secondary | ICD-10-CM | POA: Insufficient documentation

## 2017-07-28 DIAGNOSIS — N838 Other noninflammatory disorders of ovary, fallopian tube and broad ligament: Secondary | ICD-10-CM

## 2017-07-28 DIAGNOSIS — N83202 Unspecified ovarian cyst, left side: Secondary | ICD-10-CM | POA: Diagnosis not present

## 2017-07-28 LAB — CEA: CEA: 1.1 ng/mL (ref 0.0–4.7)

## 2017-07-28 LAB — CA 125: CANCER ANTIGEN (CA) 125: 9518 U/mL — AB (ref 0.0–38.1)

## 2017-07-28 MED ORDER — IOPAMIDOL (ISOVUE-300) INJECTION 61%
125.0000 mL | Freq: Once | INTRAVENOUS | Status: AC | PRN
  Administered 2017-07-28: 125 mL via INTRAVENOUS

## 2017-07-28 NOTE — Telephone Encounter (Signed)
Pt called today to see if she can have her nails done for easter. 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 I checked with Lauren and she said it is ok but when she goes to surgery they will monitor her pulse ox by putting it on a finger and one finger may need to be without acrylic so that the monitoring can be done.  I tried calling her back a couple of times but there was no answer and voicemail full. Will try again calling tom.

## 2017-07-28 NOTE — Telephone Encounter (Signed)
Called patient to discuss results of blood work from yesterday. CA 125 again significantly elevated. Will proceed with imaging today. Given patient's anxiety discussed taking her Xanax 0.5 mg tablet 1 hour prior to imaging and if significant anxiety, can take additional tablet at imaging center. Instructed patient she would need to have driver for next 24 hours. Cautioned of side effects of xanax. Patient returned teaching and verbalizes understanding. All questions answered.

## 2017-07-28 NOTE — Telephone Encounter (Signed)
Faxed request to OR, pre op and Denyse Dago at [facility] with document:  Booking paper, surgical  consent and got confirmation of all faxes went through. Patients's surgery date is 4/24. Preop appt is 4/19 from 9 am to 1 pm.Christina was given booking paper to get surgery approved.

## 2017-07-28 NOTE — Telephone Encounter (Signed)
Called pt and gave her the preop day for tom. 4/19 and it will be telephone call and she should be available for the call between 9 am and 1 pm. I asked if she had any questions for me and she said she is still trying to get over the entire situation but she has our numbers if she has questions

## 2017-07-29 ENCOUNTER — Encounter
Admission: RE | Admit: 2017-07-29 | Discharge: 2017-07-29 | Disposition: A | Discharge status: Home / Self Care | Payer: Federal, State, Local not specified - PPO | Source: Ambulatory Visit | Attending: Obstetrics and Gynecology | Admitting: Obstetrics and Gynecology

## 2017-07-29 ENCOUNTER — Other Ambulatory Visit: Payer: Self-pay

## 2017-07-29 HISTORY — DX: Personal history of Methicillin resistant Staphylococcus aureus infection: Z86.14

## 2017-07-29 HISTORY — DX: Gastro-esophageal reflux disease without esophagitis: K21.9

## 2017-07-29 HISTORY — DX: Malignant (primary) neoplasm, unspecified: C80.1

## 2017-07-29 LAB — PAP LB AND HPV HIGH-RISK
HPV, high-risk: NEGATIVE
PAP Smear Comment: 0

## 2017-07-29 NOTE — Patient Instructions (Signed)
Your procedure is scheduled on: 08-03-17 [facility] Report to Same Day Surgery 2nd floor medical mall [facility] Entrance-take elevator on left to 2nd floor.  Check in with surgery information desk.) To find out your arrival time please call [phone] between 1PM - 3PM on 08-02-17 TUESDAY  Remember: Instructions that are not followed completely may result in serious medical risk, up to and including death, or upon the discretion of your surgeon and anesthesiologist your surgery may need to be rescheduled.    _x___ 1. Do not eat food after midnight the night before your procedure. NO GUM OR CANDY AFTER MIDNIGHT.  You may drink clear liquids up to 2 hours before you are scheduled to arrive at the hospital for your procedure.  Do not drink clear liquids within 2 hours of your scheduled arrival to the hospital.  Clear liquids include  --Water or Apple juice without pulp  --Clear carbohydrate beverage such as ClearFast or Gatorade  --Black Coffee or Clear Tea (No milk, no creamers, do not add anything to the coffee or Tea     __x__ 2. No Alcohol for 24 hours before or after surgery.   __x__3. No Smoking or e-cigarettes for 24 prior to surgery.  Do not use any chewable tobacco products for at least 6 hour prior to surgery   ____  4. Bring all medications with you on the day of surgery if instructed.    __x__ 5. Notify your doctor if there is any change in your medical condition     (cold, fever, infections).    x___6. On the morning of surgery brush your teeth with toothpaste and water.  You may rinse your mouth with mouth wash if you wish.  Do not swallow any toothpaste or mouthwash.   Do not wear jewelry, make-up, hairpins, clips or nail polish.  Do not wear lotions, powders, or perfumes. You may wear deodorant.  Do not shave 48 hours prior to surgery. Men may shave face and neck.  Do not bring valuables to the hospital.    [facility] is not responsible for any belongings or  valuables.               Contacts, dentures or bridgework may not be worn into surgery.  Leave your suitcase in the car. After surgery it may be brought to your room.  For patients admitted to the hospital, discharge time is determined by your treatment team.  _  Patients discharged the day of surgery will not be allowed to drive home.  You will need someone to drive you home and stay with you the night of your procedure.    Please read over the following fact sheets that you were given:   [facility] Preparing for Surgery and or MRSA Information   _x___ TAKE THE FOLLOWING MEDICATION THE MORNING OF SURGERY WITH A SMALL SIP OF WATER. These include:  1. ZOLOFT (SERTRALINE)  2. YOU MAY TAKE YOUR XANAX DAY OF SURGERY IF NEEDED   3.  4.  5.  6.  ____Fleets enema or Magnesium Citrate as directed.   _x___ Use CHG Soap or sage wipes as directed on instruction sheet   ____ Use inhalers on the day of surgery and bring to hospital day of surgery  ____ Stop Metformin and Janumet 2 days prior to surgery.    ____ Take 1/2 of usual insulin dose the night before surgery and none on the morning surgery.   ____ Follow recommendations from Cardiologist, Pulmonologist  or PCP regarding stopping Aspirin, Coumadin, Plavix ,Eliquis, Effient, or Pradaxa, and Pletal.  X____Stop Anti-inflammatories such as Advil, Aleve, Ibuprofen, Motrin, Naproxen, Naprosyn, Goodies powders or aspirin products NOW-OK to take Tylenol    ____ Stop supplements until after surgery.     ____ Bring C-Pap to the hospital.

## 2017-08-01 DIAGNOSIS — R188 Other ascites: Secondary | ICD-10-CM | POA: Diagnosis not present

## 2017-08-01 DIAGNOSIS — C482 Malignant neoplasm of peritoneum, unspecified: Secondary | ICD-10-CM | POA: Diagnosis not present

## 2017-08-02 ENCOUNTER — Ambulatory Visit: Payer: Self-pay

## 2017-08-02 ENCOUNTER — Other Ambulatory Visit: Payer: Self-pay | Admitting: Obstetrics and Gynecology

## 2017-08-02 DIAGNOSIS — F32A Depression, unspecified: Secondary | ICD-10-CM

## 2017-08-02 DIAGNOSIS — F419 Anxiety disorder, unspecified: Principal | ICD-10-CM

## 2017-08-02 DIAGNOSIS — F329 Major depressive disorder, single episode, unspecified: Secondary | ICD-10-CM

## 2017-08-02 MED ORDER — ALPRAZOLAM 0.5 MG PO TABS: ORAL_TABLET | ORAL | 0 refills | Status: DC

## 2017-08-02 NOTE — Progress Notes (Signed)
Rx RF xanax. Pt taking about BID due to recent dx of possible ovar ca vs other ovar mass. Seeing Dr. Theora Gianotti.

## 2017-08-02 NOTE — Telephone Encounter (Signed)
Rx RF faxed.

## 2017-08-03 ENCOUNTER — Inpatient Hospital Stay
Admission: RE | Admit: 2017-08-03 | Payer: Federal, State, Local not specified - PPO | Source: Ambulatory Visit | Admitting: Obstetrics and Gynecology

## 2017-08-03 ENCOUNTER — Encounter: Admission: RE | Payer: Self-pay | Source: Ambulatory Visit

## 2017-08-03 SURGERY — ROBOTIC ASSISTED TOTAL HYSTERECTOMY
Anesthesia: Choice | Laterality: Bilateral

## 2017-08-05 ENCOUNTER — Other Ambulatory Visit: Payer: Self-pay

## 2017-08-05 NOTE — Progress Notes (Signed)
error 

## 2017-08-08 ENCOUNTER — Other Ambulatory Visit: Payer: Self-pay

## 2017-08-09 DIAGNOSIS — C569 Malignant neoplasm of unspecified ovary: Secondary | ICD-10-CM | POA: Diagnosis not present

## 2017-08-09 DIAGNOSIS — C801 Malignant (primary) neoplasm, unspecified: Secondary | ICD-10-CM | POA: Diagnosis not present

## 2017-08-09 DIAGNOSIS — R590 Localized enlarged lymph nodes: Secondary | ICD-10-CM | POA: Diagnosis not present

## 2017-08-09 DIAGNOSIS — C772 Secondary and unspecified malignant neoplasm of intra-abdominal lymph nodes: Secondary | ICD-10-CM | POA: Diagnosis not present

## 2017-08-09 DIAGNOSIS — R1900 Intra-abdominal and pelvic swelling, mass and lump, unspecified site: Secondary | ICD-10-CM | POA: Diagnosis not present

## 2017-08-18 ENCOUNTER — Encounter: Payer: Self-pay | Admitting: Obstetrics and Gynecology

## 2017-08-19 DIAGNOSIS — L539 Erythematous condition, unspecified: Secondary | ICD-10-CM | POA: Diagnosis not present

## 2017-08-19 DIAGNOSIS — L299 Pruritus, unspecified: Secondary | ICD-10-CM | POA: Diagnosis not present

## 2017-08-19 DIAGNOSIS — Z006 Encounter for examination for normal comparison and control in clinical research program: Secondary | ICD-10-CM | POA: Diagnosis not present

## 2017-08-19 DIAGNOSIS — Z5111 Encounter for antineoplastic chemotherapy: Secondary | ICD-10-CM | POA: Diagnosis not present

## 2017-08-19 DIAGNOSIS — R112 Nausea with vomiting, unspecified: Secondary | ICD-10-CM | POA: Diagnosis not present

## 2017-08-19 DIAGNOSIS — R1900 Intra-abdominal and pelvic swelling, mass and lump, unspecified site: Secondary | ICD-10-CM | POA: Diagnosis not present

## 2017-08-19 DIAGNOSIS — T451X5A Adverse effect of antineoplastic and immunosuppressive drugs, initial encounter: Secondary | ICD-10-CM | POA: Diagnosis not present

## 2017-08-19 DIAGNOSIS — C569 Malignant neoplasm of unspecified ovary: Secondary | ICD-10-CM | POA: Diagnosis not present

## 2017-08-26 DIAGNOSIS — X58XXXD Exposure to other specified factors, subsequent encounter: Secondary | ICD-10-CM | POA: Diagnosis not present

## 2017-08-26 DIAGNOSIS — Z5111 Encounter for antineoplastic chemotherapy: Secondary | ICD-10-CM | POA: Diagnosis not present

## 2017-08-26 DIAGNOSIS — Z006 Encounter for examination for normal comparison and control in clinical research program: Secondary | ICD-10-CM | POA: Diagnosis not present

## 2017-08-26 DIAGNOSIS — T7840XD Allergy, unspecified, subsequent encounter: Secondary | ICD-10-CM | POA: Diagnosis not present

## 2017-08-26 DIAGNOSIS — F411 Generalized anxiety disorder: Secondary | ICD-10-CM | POA: Diagnosis not present

## 2017-08-26 DIAGNOSIS — R112 Nausea with vomiting, unspecified: Secondary | ICD-10-CM | POA: Diagnosis not present

## 2017-08-26 DIAGNOSIS — R0789 Other chest pain: Secondary | ICD-10-CM | POA: Diagnosis not present

## 2017-08-26 DIAGNOSIS — R0602 Shortness of breath: Secondary | ICD-10-CM | POA: Diagnosis not present

## 2017-08-26 DIAGNOSIS — R232 Flushing: Secondary | ICD-10-CM | POA: Diagnosis not present

## 2017-08-26 DIAGNOSIS — T451X5A Adverse effect of antineoplastic and immunosuppressive drugs, initial encounter: Secondary | ICD-10-CM | POA: Diagnosis not present

## 2017-08-26 DIAGNOSIS — C569 Malignant neoplasm of unspecified ovary: Secondary | ICD-10-CM | POA: Diagnosis not present

## 2017-08-26 DIAGNOSIS — L539 Erythematous condition, unspecified: Secondary | ICD-10-CM | POA: Diagnosis not present

## 2017-09-02 DIAGNOSIS — Z006 Encounter for examination for normal comparison and control in clinical research program: Secondary | ICD-10-CM | POA: Diagnosis not present

## 2017-09-02 DIAGNOSIS — T451X5D Adverse effect of antineoplastic and immunosuppressive drugs, subsequent encounter: Secondary | ICD-10-CM | POA: Diagnosis not present

## 2017-09-02 DIAGNOSIS — Z5111 Encounter for antineoplastic chemotherapy: Secondary | ICD-10-CM | POA: Diagnosis not present

## 2017-09-02 DIAGNOSIS — R112 Nausea with vomiting, unspecified: Secondary | ICD-10-CM | POA: Diagnosis not present

## 2017-09-07 ENCOUNTER — Ambulatory Visit: Payer: Federal, State, Local not specified - PPO

## 2017-09-13 DIAGNOSIS — Z006 Encounter for examination for normal comparison and control in clinical research program: Secondary | ICD-10-CM | POA: Diagnosis not present

## 2017-09-13 DIAGNOSIS — F419 Anxiety disorder, unspecified: Secondary | ICD-10-CM | POA: Diagnosis not present

## 2017-09-13 DIAGNOSIS — T7840XD Allergy, unspecified, subsequent encounter: Secondary | ICD-10-CM | POA: Diagnosis not present

## 2017-09-13 DIAGNOSIS — R188 Other ascites: Secondary | ICD-10-CM | POA: Diagnosis not present

## 2017-09-13 DIAGNOSIS — F411 Generalized anxiety disorder: Secondary | ICD-10-CM | POA: Diagnosis not present

## 2017-09-13 DIAGNOSIS — Z5112 Encounter for antineoplastic immunotherapy: Secondary | ICD-10-CM | POA: Diagnosis not present

## 2017-09-13 DIAGNOSIS — R1900 Intra-abdominal and pelvic swelling, mass and lump, unspecified site: Secondary | ICD-10-CM | POA: Diagnosis not present

## 2017-09-13 DIAGNOSIS — Z5111 Encounter for antineoplastic chemotherapy: Secondary | ICD-10-CM | POA: Diagnosis not present

## 2017-09-13 DIAGNOSIS — C569 Malignant neoplasm of unspecified ovary: Secondary | ICD-10-CM | POA: Diagnosis not present

## 2017-09-13 DIAGNOSIS — Z79899 Other long term (current) drug therapy: Secondary | ICD-10-CM | POA: Diagnosis not present

## 2017-09-13 DIAGNOSIS — R112 Nausea with vomiting, unspecified: Secondary | ICD-10-CM | POA: Diagnosis not present

## 2017-09-13 DIAGNOSIS — R740 Nonspecific elevation of levels of transaminase and lactic acid dehydrogenase [LDH]: Secondary | ICD-10-CM | POA: Diagnosis not present

## 2017-09-13 DIAGNOSIS — Z87891 Personal history of nicotine dependence: Secondary | ICD-10-CM | POA: Diagnosis not present

## 2017-09-13 DIAGNOSIS — T451X5A Adverse effect of antineoplastic and immunosuppressive drugs, initial encounter: Secondary | ICD-10-CM | POA: Diagnosis not present

## 2017-09-20 DIAGNOSIS — R1900 Intra-abdominal and pelvic swelling, mass and lump, unspecified site: Secondary | ICD-10-CM | POA: Diagnosis not present

## 2017-09-20 DIAGNOSIS — T451X5A Adverse effect of antineoplastic and immunosuppressive drugs, initial encounter: Secondary | ICD-10-CM | POA: Diagnosis not present

## 2017-09-20 DIAGNOSIS — T7840XD Allergy, unspecified, subsequent encounter: Secondary | ICD-10-CM | POA: Diagnosis not present

## 2017-09-20 DIAGNOSIS — Z006 Encounter for examination for normal comparison and control in clinical research program: Secondary | ICD-10-CM | POA: Diagnosis not present

## 2017-09-20 DIAGNOSIS — R112 Nausea with vomiting, unspecified: Secondary | ICD-10-CM | POA: Diagnosis not present

## 2017-09-20 DIAGNOSIS — F411 Generalized anxiety disorder: Secondary | ICD-10-CM | POA: Diagnosis not present

## 2017-09-20 DIAGNOSIS — X58XXXD Exposure to other specified factors, subsequent encounter: Secondary | ICD-10-CM | POA: Diagnosis not present

## 2017-09-27 DIAGNOSIS — R1900 Intra-abdominal and pelvic swelling, mass and lump, unspecified site: Secondary | ICD-10-CM | POA: Diagnosis not present

## 2017-09-27 DIAGNOSIS — Z006 Encounter for examination for normal comparison and control in clinical research program: Secondary | ICD-10-CM | POA: Diagnosis not present

## 2017-09-27 DIAGNOSIS — F411 Generalized anxiety disorder: Secondary | ICD-10-CM | POA: Diagnosis not present

## 2017-09-27 DIAGNOSIS — T451X5A Adverse effect of antineoplastic and immunosuppressive drugs, initial encounter: Secondary | ICD-10-CM | POA: Diagnosis not present

## 2017-09-27 DIAGNOSIS — R399 Unspecified symptoms and signs involving the genitourinary system: Secondary | ICD-10-CM | POA: Diagnosis not present

## 2017-09-27 DIAGNOSIS — R112 Nausea with vomiting, unspecified: Secondary | ICD-10-CM | POA: Diagnosis not present

## 2017-09-27 DIAGNOSIS — X58XXXD Exposure to other specified factors, subsequent encounter: Secondary | ICD-10-CM | POA: Diagnosis not present

## 2017-09-27 DIAGNOSIS — T7840XD Allergy, unspecified, subsequent encounter: Secondary | ICD-10-CM | POA: Diagnosis not present

## 2017-10-04 ENCOUNTER — Other Ambulatory Visit: Payer: Self-pay | Admitting: Obstetrics and Gynecology

## 2017-10-04 DIAGNOSIS — C562 Malignant neoplasm of left ovary: Secondary | ICD-10-CM | POA: Diagnosis not present

## 2017-10-04 DIAGNOSIS — R112 Nausea with vomiting, unspecified: Secondary | ICD-10-CM | POA: Diagnosis not present

## 2017-10-04 DIAGNOSIS — T7840XD Allergy, unspecified, subsequent encounter: Secondary | ICD-10-CM | POA: Diagnosis not present

## 2017-10-04 DIAGNOSIS — R399 Unspecified symptoms and signs involving the genitourinary system: Secondary | ICD-10-CM | POA: Diagnosis not present

## 2017-10-04 DIAGNOSIS — K760 Fatty (change of) liver, not elsewhere classified: Secondary | ICD-10-CM | POA: Diagnosis not present

## 2017-10-04 DIAGNOSIS — F329 Major depressive disorder, single episode, unspecified: Secondary | ICD-10-CM

## 2017-10-04 DIAGNOSIS — Z5112 Encounter for antineoplastic immunotherapy: Secondary | ICD-10-CM | POA: Diagnosis not present

## 2017-10-04 DIAGNOSIS — F32A Depression, unspecified: Secondary | ICD-10-CM

## 2017-10-04 DIAGNOSIS — Z5111 Encounter for antineoplastic chemotherapy: Secondary | ICD-10-CM | POA: Diagnosis not present

## 2017-10-04 DIAGNOSIS — F419 Anxiety disorder, unspecified: Principal | ICD-10-CM

## 2017-10-04 DIAGNOSIS — Z006 Encounter for examination for normal comparison and control in clinical research program: Secondary | ICD-10-CM | POA: Diagnosis not present

## 2017-10-04 DIAGNOSIS — Z87891 Personal history of nicotine dependence: Secondary | ICD-10-CM | POA: Diagnosis not present

## 2017-10-04 DIAGNOSIS — Z6841 Body Mass Index (BMI) 40.0 and over, adult: Secondary | ICD-10-CM | POA: Diagnosis not present

## 2017-10-04 DIAGNOSIS — F411 Generalized anxiety disorder: Secondary | ICD-10-CM | POA: Diagnosis not present

## 2017-10-04 DIAGNOSIS — R188 Other ascites: Secondary | ICD-10-CM | POA: Diagnosis not present

## 2017-10-04 DIAGNOSIS — T451X5A Adverse effect of antineoplastic and immunosuppressive drugs, initial encounter: Secondary | ICD-10-CM | POA: Diagnosis not present

## 2017-10-04 DIAGNOSIS — I10 Essential (primary) hypertension: Secondary | ICD-10-CM | POA: Diagnosis not present

## 2017-10-04 NOTE — Telephone Encounter (Signed)
Please advise. Thank you

## 2017-10-05 ENCOUNTER — Other Ambulatory Visit: Payer: Self-pay | Admitting: Obstetrics and Gynecology

## 2017-10-05 DIAGNOSIS — F32A Depression, unspecified: Secondary | ICD-10-CM

## 2017-10-05 DIAGNOSIS — F419 Anxiety disorder, unspecified: Principal | ICD-10-CM

## 2017-10-05 DIAGNOSIS — F329 Major depressive disorder, single episode, unspecified: Secondary | ICD-10-CM

## 2017-10-10 DIAGNOSIS — I2699 Other pulmonary embolism without acute cor pulmonale: Secondary | ICD-10-CM

## 2017-10-10 DIAGNOSIS — I82409 Acute embolism and thrombosis of unspecified deep veins of unspecified lower extremity: Secondary | ICD-10-CM

## 2017-10-10 HISTORY — DX: Other pulmonary embolism without acute cor pulmonale: I26.99

## 2017-10-10 HISTORY — DX: Acute embolism and thrombosis of unspecified deep veins of unspecified lower extremity: I82.409

## 2017-10-11 DIAGNOSIS — Z006 Encounter for examination for normal comparison and control in clinical research program: Secondary | ICD-10-CM | POA: Diagnosis not present

## 2017-10-11 DIAGNOSIS — T7840XD Allergy, unspecified, subsequent encounter: Secondary | ICD-10-CM | POA: Diagnosis not present

## 2017-10-11 DIAGNOSIS — T451X5A Adverse effect of antineoplastic and immunosuppressive drugs, initial encounter: Secondary | ICD-10-CM | POA: Diagnosis not present

## 2017-10-11 DIAGNOSIS — R1900 Intra-abdominal and pelvic swelling, mass and lump, unspecified site: Secondary | ICD-10-CM | POA: Diagnosis not present

## 2017-10-11 DIAGNOSIS — X58XXXD Exposure to other specified factors, subsequent encounter: Secondary | ICD-10-CM | POA: Diagnosis not present

## 2017-10-11 DIAGNOSIS — R112 Nausea with vomiting, unspecified: Secondary | ICD-10-CM | POA: Diagnosis not present

## 2017-10-11 DIAGNOSIS — N390 Urinary tract infection, site not specified: Secondary | ICD-10-CM | POA: Diagnosis not present

## 2017-10-11 DIAGNOSIS — Z87891 Personal history of nicotine dependence: Secondary | ICD-10-CM | POA: Diagnosis not present

## 2017-10-11 DIAGNOSIS — F411 Generalized anxiety disorder: Secondary | ICD-10-CM | POA: Diagnosis not present

## 2017-10-14 DIAGNOSIS — Z006 Encounter for examination for normal comparison and control in clinical research program: Secondary | ICD-10-CM | POA: Diagnosis not present

## 2017-10-14 DIAGNOSIS — X58XXXD Exposure to other specified factors, subsequent encounter: Secondary | ICD-10-CM | POA: Diagnosis not present

## 2017-10-14 DIAGNOSIS — D701 Agranulocytosis secondary to cancer chemotherapy: Secondary | ICD-10-CM | POA: Diagnosis not present

## 2017-10-14 DIAGNOSIS — R112 Nausea with vomiting, unspecified: Secondary | ICD-10-CM | POA: Diagnosis not present

## 2017-10-14 DIAGNOSIS — T7840XD Allergy, unspecified, subsequent encounter: Secondary | ICD-10-CM | POA: Diagnosis not present

## 2017-10-14 DIAGNOSIS — R399 Unspecified symptoms and signs involving the genitourinary system: Secondary | ICD-10-CM | POA: Diagnosis not present

## 2017-10-14 DIAGNOSIS — R1900 Intra-abdominal and pelvic swelling, mass and lump, unspecified site: Secondary | ICD-10-CM | POA: Diagnosis not present

## 2017-10-14 DIAGNOSIS — F411 Generalized anxiety disorder: Secondary | ICD-10-CM | POA: Diagnosis not present

## 2017-10-14 DIAGNOSIS — T451X5A Adverse effect of antineoplastic and immunosuppressive drugs, initial encounter: Secondary | ICD-10-CM | POA: Diagnosis not present

## 2017-10-15 ENCOUNTER — Other Ambulatory Visit
Admission: RE | Admit: 2017-10-15 | Discharge: 2017-10-15 | Disposition: A | Discharge status: Home / Self Care | Payer: Federal, State, Local not specified - PPO | Source: Ambulatory Visit | Attending: Obstetrics and Gynecology | Admitting: Obstetrics and Gynecology

## 2017-10-15 DIAGNOSIS — C569 Malignant neoplasm of unspecified ovary: Secondary | ICD-10-CM | POA: Insufficient documentation

## 2017-10-15 LAB — CBC WITH DIFFERENTIAL/PLATELET
BASOS ABS: 0 10*3/uL (ref 0–0.1)
Basophils Relative: 1 %
EOS ABS: 0 10*3/uL (ref 0–0.7)
EOS PCT: 1 %
HCT: 33.1 % — ABNORMAL LOW (ref 35.0–47.0)
Hemoglobin: 11.3 g/dL — ABNORMAL LOW (ref 12.0–16.0)
LYMPHS ABS: 0.5 10*3/uL — AB (ref 1.0–3.6)
Lymphocytes Relative: 60 %
MCH: 28.8 pg (ref 26.0–34.0)
MCHC: 34 g/dL (ref 32.0–36.0)
MCV: 84.7 fL (ref 80.0–100.0)
Monocytes Absolute: 0.1 10*3/uL — ABNORMAL LOW (ref 0.2–0.9)
Monocytes Relative: 19 %
Neutro Abs: 0.1 10*3/uL — ABNORMAL LOW (ref 1.4–6.5)
Neutrophils Relative %: 19 %
PLATELETS: 112 10*3/uL — AB (ref 150–440)
RBC: 3.91 MIL/uL (ref 3.80–5.20)
RDW: 16.3 % — AB (ref 11.5–14.5)
WBC: 0.8 10*3/uL — CL (ref 3.6–11.0)

## 2017-10-17 ENCOUNTER — Other Ambulatory Visit
Admission: RE | Admit: 2017-10-17 | Discharge: 2017-10-17 | Disposition: A | Discharge status: Home / Self Care | Payer: Federal, State, Local not specified - PPO | Source: Ambulatory Visit | Attending: Obstetrics and Gynecology | Admitting: Obstetrics and Gynecology

## 2017-10-17 DIAGNOSIS — N839 Noninflammatory disorder of ovary, fallopian tube and broad ligament, unspecified: Secondary | ICD-10-CM | POA: Diagnosis not present

## 2017-10-17 LAB — CBC WITH DIFFERENTIAL/PLATELET
BASOS PCT: 1 %
Basophils Absolute: 0 10*3/uL (ref 0–0.1)
Eosinophils Absolute: 0 10*3/uL (ref 0–0.7)
Eosinophils Relative: 0 %
HEMATOCRIT: 34.8 % — AB (ref 35.0–47.0)
HEMOGLOBIN: 12 g/dL (ref 12.0–16.0)
LYMPHS ABS: 0.9 10*3/uL — AB (ref 1.0–3.6)
Lymphocytes Relative: 29 %
MCH: 29.1 pg (ref 26.0–34.0)
MCHC: 34.6 g/dL (ref 32.0–36.0)
MCV: 84 fL (ref 80.0–100.0)
MONOS PCT: 16 %
Monocytes Absolute: 0.5 10*3/uL (ref 0.2–0.9)
NEUTROS ABS: 1.7 10*3/uL (ref 1.4–6.5)
NEUTROS PCT: 54 %
Platelets: 114 10*3/uL — ABNORMAL LOW (ref 150–440)
RBC: 4.14 MIL/uL (ref 3.80–5.20)
RDW: 16.3 % — ABNORMAL HIGH (ref 11.5–14.5)
WBC: 3.1 10*3/uL — ABNORMAL LOW (ref 3.6–11.0)

## 2017-10-17 LAB — URINALYSIS, COMPLETE (UACMP) WITH MICROSCOPIC
BACTERIA UA: NONE SEEN
Bilirubin Urine: NEGATIVE
Glucose, UA: NEGATIVE mg/dL
Ketones, ur: NEGATIVE mg/dL
LEUKOCYTES UA: NEGATIVE
Nitrite: NEGATIVE
PH: 5 (ref 5.0–8.0)
Protein, ur: 100 mg/dL — AB
SPECIFIC GRAVITY, URINE: 1.02 (ref 1.005–1.030)

## 2017-10-19 DIAGNOSIS — T451X5A Adverse effect of antineoplastic and immunosuppressive drugs, initial encounter: Secondary | ICD-10-CM | POA: Diagnosis not present

## 2017-10-19 DIAGNOSIS — I10 Essential (primary) hypertension: Secondary | ICD-10-CM | POA: Diagnosis not present

## 2017-10-19 DIAGNOSIS — Z006 Encounter for examination for normal comparison and control in clinical research program: Secondary | ICD-10-CM | POA: Diagnosis not present

## 2017-10-19 DIAGNOSIS — R112 Nausea with vomiting, unspecified: Secondary | ICD-10-CM | POA: Diagnosis not present

## 2017-10-19 DIAGNOSIS — T7840XD Allergy, unspecified, subsequent encounter: Secondary | ICD-10-CM | POA: Diagnosis not present

## 2017-10-19 DIAGNOSIS — R1900 Intra-abdominal and pelvic swelling, mass and lump, unspecified site: Secondary | ICD-10-CM | POA: Diagnosis not present

## 2017-10-19 DIAGNOSIS — F411 Generalized anxiety disorder: Secondary | ICD-10-CM | POA: Diagnosis not present

## 2017-10-19 DIAGNOSIS — X58XXXD Exposure to other specified factors, subsequent encounter: Secondary | ICD-10-CM | POA: Diagnosis not present

## 2017-10-19 LAB — URINE CULTURE

## 2017-10-25 DIAGNOSIS — C569 Malignant neoplasm of unspecified ovary: Secondary | ICD-10-CM | POA: Diagnosis not present

## 2017-10-25 DIAGNOSIS — Z882 Allergy status to sulfonamides status: Secondary | ICD-10-CM | POA: Diagnosis not present

## 2017-10-25 DIAGNOSIS — L299 Pruritus, unspecified: Secondary | ICD-10-CM | POA: Diagnosis not present

## 2017-10-25 DIAGNOSIS — Z7901 Long term (current) use of anticoagulants: Secondary | ICD-10-CM | POA: Diagnosis not present

## 2017-10-25 DIAGNOSIS — Z6841 Body Mass Index (BMI) 40.0 and over, adult: Secondary | ICD-10-CM | POA: Diagnosis not present

## 2017-10-25 DIAGNOSIS — I10 Essential (primary) hypertension: Secondary | ICD-10-CM | POA: Diagnosis not present

## 2017-10-25 DIAGNOSIS — F411 Generalized anxiety disorder: Secondary | ICD-10-CM | POA: Diagnosis not present

## 2017-10-25 DIAGNOSIS — C786 Secondary malignant neoplasm of retroperitoneum and peritoneum: Secondary | ICD-10-CM | POA: Diagnosis not present

## 2017-10-25 DIAGNOSIS — R918 Other nonspecific abnormal finding of lung field: Secondary | ICD-10-CM | POA: Diagnosis not present

## 2017-10-25 DIAGNOSIS — D701 Agranulocytosis secondary to cancer chemotherapy: Secondary | ICD-10-CM | POA: Diagnosis not present

## 2017-10-25 DIAGNOSIS — T451X5A Adverse effect of antineoplastic and immunosuppressive drugs, initial encounter: Secondary | ICD-10-CM | POA: Diagnosis not present

## 2017-10-25 DIAGNOSIS — I2699 Other pulmonary embolism without acute cor pulmonale: Secondary | ICD-10-CM | POA: Diagnosis not present

## 2017-10-25 DIAGNOSIS — C562 Malignant neoplasm of left ovary: Secondary | ICD-10-CM | POA: Diagnosis not present

## 2017-10-25 DIAGNOSIS — R1900 Intra-abdominal and pelvic swelling, mass and lump, unspecified site: Secondary | ICD-10-CM | POA: Diagnosis not present

## 2017-10-25 DIAGNOSIS — F329 Major depressive disorder, single episode, unspecified: Secondary | ICD-10-CM | POA: Diagnosis not present

## 2017-10-25 DIAGNOSIS — I81 Portal vein thrombosis: Secondary | ICD-10-CM | POA: Diagnosis not present

## 2017-10-25 DIAGNOSIS — E279 Disorder of adrenal gland, unspecified: Secondary | ICD-10-CM | POA: Diagnosis not present

## 2017-10-25 DIAGNOSIS — Z9049 Acquired absence of other specified parts of digestive tract: Secondary | ICD-10-CM | POA: Diagnosis not present

## 2017-10-25 DIAGNOSIS — C801 Malignant (primary) neoplasm, unspecified: Secondary | ICD-10-CM | POA: Diagnosis not present

## 2017-10-25 DIAGNOSIS — K219 Gastro-esophageal reflux disease without esophagitis: Secondary | ICD-10-CM | POA: Diagnosis not present

## 2017-10-25 DIAGNOSIS — I82411 Acute embolism and thrombosis of right femoral vein: Secondary | ICD-10-CM | POA: Diagnosis not present

## 2017-10-25 DIAGNOSIS — Z87891 Personal history of nicotine dependence: Secondary | ICD-10-CM | POA: Diagnosis not present

## 2017-10-26 DIAGNOSIS — R1900 Intra-abdominal and pelvic swelling, mass and lump, unspecified site: Secondary | ICD-10-CM | POA: Diagnosis not present

## 2017-10-26 DIAGNOSIS — C562 Malignant neoplasm of left ovary: Secondary | ICD-10-CM | POA: Diagnosis not present

## 2017-10-26 DIAGNOSIS — F419 Anxiety disorder, unspecified: Secondary | ICD-10-CM | POA: Diagnosis not present

## 2017-10-26 DIAGNOSIS — I10 Essential (primary) hypertension: Secondary | ICD-10-CM | POA: Diagnosis not present

## 2017-10-26 DIAGNOSIS — I2699 Other pulmonary embolism without acute cor pulmonale: Secondary | ICD-10-CM | POA: Diagnosis not present

## 2017-10-28 DIAGNOSIS — C562 Malignant neoplasm of left ovary: Secondary | ICD-10-CM | POA: Diagnosis not present

## 2017-10-28 DIAGNOSIS — I2699 Other pulmonary embolism without acute cor pulmonale: Secondary | ICD-10-CM | POA: Diagnosis not present

## 2017-10-28 DIAGNOSIS — R1900 Intra-abdominal and pelvic swelling, mass and lump, unspecified site: Secondary | ICD-10-CM | POA: Diagnosis not present

## 2017-11-01 ENCOUNTER — Encounter: Payer: Self-pay | Admitting: Obstetrics and Gynecology

## 2017-11-08 DIAGNOSIS — I81 Portal vein thrombosis: Secondary | ICD-10-CM | POA: Diagnosis not present

## 2017-11-08 DIAGNOSIS — Z5111 Encounter for antineoplastic chemotherapy: Secondary | ICD-10-CM | POA: Diagnosis not present

## 2017-11-08 DIAGNOSIS — G629 Polyneuropathy, unspecified: Secondary | ICD-10-CM | POA: Diagnosis not present

## 2017-11-08 DIAGNOSIS — C786 Secondary malignant neoplasm of retroperitoneum and peritoneum: Secondary | ICD-10-CM | POA: Diagnosis not present

## 2017-11-08 DIAGNOSIS — C562 Malignant neoplasm of left ovary: Secondary | ICD-10-CM | POA: Diagnosis not present

## 2017-11-08 DIAGNOSIS — R21 Rash and other nonspecific skin eruption: Secondary | ICD-10-CM | POA: Diagnosis not present

## 2017-11-08 DIAGNOSIS — N390 Urinary tract infection, site not specified: Secondary | ICD-10-CM | POA: Diagnosis not present

## 2017-11-08 DIAGNOSIS — T451X5A Adverse effect of antineoplastic and immunosuppressive drugs, initial encounter: Secondary | ICD-10-CM | POA: Diagnosis not present

## 2017-11-08 DIAGNOSIS — F411 Generalized anxiety disorder: Secondary | ICD-10-CM | POA: Diagnosis not present

## 2017-11-08 DIAGNOSIS — I82419 Acute embolism and thrombosis of unspecified femoral vein: Secondary | ICD-10-CM | POA: Diagnosis not present

## 2017-11-08 DIAGNOSIS — E278 Other specified disorders of adrenal gland: Secondary | ICD-10-CM | POA: Diagnosis not present

## 2017-11-08 DIAGNOSIS — K219 Gastro-esophageal reflux disease without esophagitis: Secondary | ICD-10-CM | POA: Diagnosis not present

## 2017-11-08 DIAGNOSIS — Z006 Encounter for examination for normal comparison and control in clinical research program: Secondary | ICD-10-CM | POA: Diagnosis not present

## 2017-11-08 DIAGNOSIS — T7840XD Allergy, unspecified, subsequent encounter: Secondary | ICD-10-CM | POA: Diagnosis not present

## 2017-11-08 DIAGNOSIS — R1900 Intra-abdominal and pelvic swelling, mass and lump, unspecified site: Secondary | ICD-10-CM | POA: Diagnosis not present

## 2017-11-08 DIAGNOSIS — R112 Nausea with vomiting, unspecified: Secondary | ICD-10-CM | POA: Diagnosis not present

## 2017-11-08 DIAGNOSIS — I2699 Other pulmonary embolism without acute cor pulmonale: Secondary | ICD-10-CM | POA: Diagnosis not present

## 2017-11-15 DIAGNOSIS — Z5111 Encounter for antineoplastic chemotherapy: Secondary | ICD-10-CM | POA: Diagnosis not present

## 2017-11-15 DIAGNOSIS — T7840XD Allergy, unspecified, subsequent encounter: Secondary | ICD-10-CM | POA: Diagnosis not present

## 2017-11-15 DIAGNOSIS — X58XXXD Exposure to other specified factors, subsequent encounter: Secondary | ICD-10-CM | POA: Diagnosis not present

## 2017-11-15 DIAGNOSIS — R1900 Intra-abdominal and pelvic swelling, mass and lump, unspecified site: Secondary | ICD-10-CM | POA: Diagnosis not present

## 2017-11-15 DIAGNOSIS — R112 Nausea with vomiting, unspecified: Secondary | ICD-10-CM | POA: Diagnosis not present

## 2017-11-15 DIAGNOSIS — T451X5A Adverse effect of antineoplastic and immunosuppressive drugs, initial encounter: Secondary | ICD-10-CM | POA: Diagnosis not present

## 2017-11-15 DIAGNOSIS — R399 Unspecified symptoms and signs involving the genitourinary system: Secondary | ICD-10-CM | POA: Diagnosis not present

## 2017-11-15 DIAGNOSIS — C562 Malignant neoplasm of left ovary: Secondary | ICD-10-CM | POA: Diagnosis not present

## 2017-11-15 DIAGNOSIS — F411 Generalized anxiety disorder: Secondary | ICD-10-CM | POA: Diagnosis not present

## 2017-11-15 DIAGNOSIS — Z006 Encounter for examination for normal comparison and control in clinical research program: Secondary | ICD-10-CM | POA: Diagnosis not present

## 2017-11-22 DIAGNOSIS — Z006 Encounter for examination for normal comparison and control in clinical research program: Secondary | ICD-10-CM | POA: Diagnosis not present

## 2017-11-22 DIAGNOSIS — R1900 Intra-abdominal and pelvic swelling, mass and lump, unspecified site: Secondary | ICD-10-CM | POA: Diagnosis not present

## 2017-11-22 DIAGNOSIS — T451X5A Adverse effect of antineoplastic and immunosuppressive drugs, initial encounter: Secondary | ICD-10-CM | POA: Diagnosis not present

## 2017-11-22 DIAGNOSIS — Z5111 Encounter for antineoplastic chemotherapy: Secondary | ICD-10-CM | POA: Diagnosis not present

## 2017-11-22 DIAGNOSIS — R399 Unspecified symptoms and signs involving the genitourinary system: Secondary | ICD-10-CM | POA: Diagnosis not present

## 2017-11-22 DIAGNOSIS — X58XXXD Exposure to other specified factors, subsequent encounter: Secondary | ICD-10-CM | POA: Diagnosis not present

## 2017-11-22 DIAGNOSIS — C562 Malignant neoplasm of left ovary: Secondary | ICD-10-CM | POA: Diagnosis not present

## 2017-11-22 DIAGNOSIS — R112 Nausea with vomiting, unspecified: Secondary | ICD-10-CM | POA: Diagnosis not present

## 2017-11-22 DIAGNOSIS — F411 Generalized anxiety disorder: Secondary | ICD-10-CM | POA: Diagnosis not present

## 2017-11-22 DIAGNOSIS — T7840XD Allergy, unspecified, subsequent encounter: Secondary | ICD-10-CM | POA: Diagnosis not present

## 2017-11-29 DIAGNOSIS — T451X5A Adverse effect of antineoplastic and immunosuppressive drugs, initial encounter: Secondary | ICD-10-CM | POA: Diagnosis not present

## 2017-11-29 DIAGNOSIS — X58XXXD Exposure to other specified factors, subsequent encounter: Secondary | ICD-10-CM | POA: Diagnosis not present

## 2017-11-29 DIAGNOSIS — Z006 Encounter for examination for normal comparison and control in clinical research program: Secondary | ICD-10-CM | POA: Diagnosis not present

## 2017-11-29 DIAGNOSIS — T7840XD Allergy, unspecified, subsequent encounter: Secondary | ICD-10-CM | POA: Diagnosis not present

## 2017-11-29 DIAGNOSIS — R112 Nausea with vomiting, unspecified: Secondary | ICD-10-CM | POA: Diagnosis not present

## 2017-11-29 DIAGNOSIS — Z5111 Encounter for antineoplastic chemotherapy: Secondary | ICD-10-CM | POA: Diagnosis not present

## 2017-11-29 DIAGNOSIS — F411 Generalized anxiety disorder: Secondary | ICD-10-CM | POA: Diagnosis not present

## 2017-11-29 DIAGNOSIS — C562 Malignant neoplasm of left ovary: Secondary | ICD-10-CM | POA: Diagnosis not present

## 2017-11-29 DIAGNOSIS — R1900 Intra-abdominal and pelvic swelling, mass and lump, unspecified site: Secondary | ICD-10-CM | POA: Diagnosis not present

## 2017-11-29 DIAGNOSIS — Z5112 Encounter for antineoplastic immunotherapy: Secondary | ICD-10-CM | POA: Diagnosis not present

## 2017-11-29 DIAGNOSIS — R399 Unspecified symptoms and signs involving the genitourinary system: Secondary | ICD-10-CM | POA: Diagnosis not present

## 2017-12-06 DIAGNOSIS — T7840XD Allergy, unspecified, subsequent encounter: Secondary | ICD-10-CM | POA: Diagnosis not present

## 2017-12-06 DIAGNOSIS — R1900 Intra-abdominal and pelvic swelling, mass and lump, unspecified site: Secondary | ICD-10-CM | POA: Diagnosis not present

## 2017-12-06 DIAGNOSIS — F411 Generalized anxiety disorder: Secondary | ICD-10-CM | POA: Diagnosis not present

## 2017-12-06 DIAGNOSIS — R399 Unspecified symptoms and signs involving the genitourinary system: Secondary | ICD-10-CM | POA: Diagnosis not present

## 2017-12-06 DIAGNOSIS — R112 Nausea with vomiting, unspecified: Secondary | ICD-10-CM | POA: Diagnosis not present

## 2017-12-06 DIAGNOSIS — X58XXXD Exposure to other specified factors, subsequent encounter: Secondary | ICD-10-CM | POA: Diagnosis not present

## 2017-12-06 DIAGNOSIS — C562 Malignant neoplasm of left ovary: Secondary | ICD-10-CM | POA: Diagnosis not present

## 2017-12-06 DIAGNOSIS — Z006 Encounter for examination for normal comparison and control in clinical research program: Secondary | ICD-10-CM | POA: Diagnosis not present

## 2017-12-06 DIAGNOSIS — T451X5A Adverse effect of antineoplastic and immunosuppressive drugs, initial encounter: Secondary | ICD-10-CM | POA: Diagnosis not present

## 2017-12-06 DIAGNOSIS — Z5111 Encounter for antineoplastic chemotherapy: Secondary | ICD-10-CM | POA: Diagnosis not present

## 2017-12-07 ENCOUNTER — Telehealth: Payer: Self-pay | Admitting: Obstetrics and Gynecology

## 2017-12-07 NOTE — Telephone Encounter (Signed)
Pt just wanted to give me update on ovar cancer tx at [facility]. Ca-125 in 220s now, down from 20,000+. Pt feeling well. Surg sched most likely for 10/19.

## 2017-12-07 NOTE — Telephone Encounter (Signed)
Pt called and would like a call back whenever you get a chance. Thank you ma'am.

## 2017-12-13 DIAGNOSIS — T7840XD Allergy, unspecified, subsequent encounter: Secondary | ICD-10-CM | POA: Diagnosis not present

## 2017-12-13 DIAGNOSIS — R1900 Intra-abdominal and pelvic swelling, mass and lump, unspecified site: Secondary | ICD-10-CM | POA: Diagnosis not present

## 2017-12-13 DIAGNOSIS — R112 Nausea with vomiting, unspecified: Secondary | ICD-10-CM | POA: Diagnosis not present

## 2017-12-13 DIAGNOSIS — C562 Malignant neoplasm of left ovary: Secondary | ICD-10-CM | POA: Diagnosis not present

## 2017-12-13 DIAGNOSIS — R399 Unspecified symptoms and signs involving the genitourinary system: Secondary | ICD-10-CM | POA: Diagnosis not present

## 2017-12-13 DIAGNOSIS — T451X5A Adverse effect of antineoplastic and immunosuppressive drugs, initial encounter: Secondary | ICD-10-CM | POA: Diagnosis not present

## 2017-12-13 DIAGNOSIS — Z5111 Encounter for antineoplastic chemotherapy: Secondary | ICD-10-CM | POA: Diagnosis not present

## 2017-12-13 DIAGNOSIS — F411 Generalized anxiety disorder: Secondary | ICD-10-CM | POA: Diagnosis not present

## 2017-12-13 DIAGNOSIS — Z006 Encounter for examination for normal comparison and control in clinical research program: Secondary | ICD-10-CM | POA: Diagnosis not present

## 2017-12-19 DIAGNOSIS — D699 Hemorrhagic condition, unspecified: Secondary | ICD-10-CM | POA: Diagnosis not present

## 2017-12-20 DIAGNOSIS — T7840XD Allergy, unspecified, subsequent encounter: Secondary | ICD-10-CM | POA: Diagnosis not present

## 2017-12-20 DIAGNOSIS — Z5111 Encounter for antineoplastic chemotherapy: Secondary | ICD-10-CM | POA: Diagnosis not present

## 2017-12-20 DIAGNOSIS — I82411 Acute embolism and thrombosis of right femoral vein: Secondary | ICD-10-CM | POA: Diagnosis not present

## 2017-12-20 DIAGNOSIS — Z806 Family history of leukemia: Secondary | ICD-10-CM | POA: Diagnosis not present

## 2017-12-20 DIAGNOSIS — F411 Generalized anxiety disorder: Secondary | ICD-10-CM | POA: Diagnosis not present

## 2017-12-20 DIAGNOSIS — Z800 Family history of malignant neoplasm of digestive organs: Secondary | ICD-10-CM | POA: Diagnosis not present

## 2017-12-20 DIAGNOSIS — C786 Secondary malignant neoplasm of retroperitoneum and peritoneum: Secondary | ICD-10-CM | POA: Diagnosis not present

## 2017-12-20 DIAGNOSIS — Z006 Encounter for examination for normal comparison and control in clinical research program: Secondary | ICD-10-CM | POA: Diagnosis not present

## 2017-12-20 DIAGNOSIS — Z5112 Encounter for antineoplastic immunotherapy: Secondary | ICD-10-CM | POA: Diagnosis not present

## 2017-12-20 DIAGNOSIS — G629 Polyneuropathy, unspecified: Secondary | ICD-10-CM | POA: Diagnosis not present

## 2017-12-20 DIAGNOSIS — T451X5A Adverse effect of antineoplastic and immunosuppressive drugs, initial encounter: Secondary | ICD-10-CM | POA: Diagnosis not present

## 2017-12-20 DIAGNOSIS — R112 Nausea with vomiting, unspecified: Secondary | ICD-10-CM | POA: Diagnosis not present

## 2017-12-20 DIAGNOSIS — I2699 Other pulmonary embolism without acute cor pulmonale: Secondary | ICD-10-CM | POA: Diagnosis not present

## 2017-12-20 DIAGNOSIS — E279 Disorder of adrenal gland, unspecified: Secondary | ICD-10-CM | POA: Diagnosis not present

## 2017-12-20 DIAGNOSIS — C801 Malignant (primary) neoplasm, unspecified: Secondary | ICD-10-CM | POA: Diagnosis not present

## 2017-12-20 DIAGNOSIS — I81 Portal vein thrombosis: Secondary | ICD-10-CM | POA: Diagnosis not present

## 2017-12-20 DIAGNOSIS — R399 Unspecified symptoms and signs involving the genitourinary system: Secondary | ICD-10-CM | POA: Diagnosis not present

## 2017-12-27 DIAGNOSIS — X58XXXD Exposure to other specified factors, subsequent encounter: Secondary | ICD-10-CM | POA: Diagnosis not present

## 2017-12-27 DIAGNOSIS — F411 Generalized anxiety disorder: Secondary | ICD-10-CM | POA: Diagnosis not present

## 2017-12-27 DIAGNOSIS — Z006 Encounter for examination for normal comparison and control in clinical research program: Secondary | ICD-10-CM | POA: Diagnosis not present

## 2017-12-27 DIAGNOSIS — R399 Unspecified symptoms and signs involving the genitourinary system: Secondary | ICD-10-CM | POA: Diagnosis not present

## 2017-12-27 DIAGNOSIS — T451X5A Adverse effect of antineoplastic and immunosuppressive drugs, initial encounter: Secondary | ICD-10-CM | POA: Diagnosis not present

## 2017-12-27 DIAGNOSIS — R112 Nausea with vomiting, unspecified: Secondary | ICD-10-CM | POA: Diagnosis not present

## 2017-12-27 DIAGNOSIS — T7840XD Allergy, unspecified, subsequent encounter: Secondary | ICD-10-CM | POA: Diagnosis not present

## 2017-12-27 DIAGNOSIS — R1900 Intra-abdominal and pelvic swelling, mass and lump, unspecified site: Secondary | ICD-10-CM | POA: Diagnosis not present

## 2018-01-03 DIAGNOSIS — Z006 Encounter for examination for normal comparison and control in clinical research program: Secondary | ICD-10-CM | POA: Diagnosis not present

## 2018-01-03 DIAGNOSIS — T451X5A Adverse effect of antineoplastic and immunosuppressive drugs, initial encounter: Secondary | ICD-10-CM | POA: Diagnosis not present

## 2018-01-03 DIAGNOSIS — T7840XD Allergy, unspecified, subsequent encounter: Secondary | ICD-10-CM | POA: Diagnosis not present

## 2018-01-03 DIAGNOSIS — R1900 Intra-abdominal and pelvic swelling, mass and lump, unspecified site: Secondary | ICD-10-CM | POA: Diagnosis not present

## 2018-01-03 DIAGNOSIS — C562 Malignant neoplasm of left ovary: Secondary | ICD-10-CM | POA: Diagnosis not present

## 2018-01-03 DIAGNOSIS — R399 Unspecified symptoms and signs involving the genitourinary system: Secondary | ICD-10-CM | POA: Diagnosis not present

## 2018-01-03 DIAGNOSIS — X58XXXD Exposure to other specified factors, subsequent encounter: Secondary | ICD-10-CM | POA: Diagnosis not present

## 2018-01-03 DIAGNOSIS — Z5111 Encounter for antineoplastic chemotherapy: Secondary | ICD-10-CM | POA: Diagnosis not present

## 2018-01-03 DIAGNOSIS — R112 Nausea with vomiting, unspecified: Secondary | ICD-10-CM | POA: Diagnosis not present

## 2018-01-03 DIAGNOSIS — F411 Generalized anxiety disorder: Secondary | ICD-10-CM | POA: Diagnosis not present

## 2018-01-09 DIAGNOSIS — Z882 Allergy status to sulfonamides status: Secondary | ICD-10-CM | POA: Diagnosis not present

## 2018-01-09 DIAGNOSIS — I2699 Other pulmonary embolism without acute cor pulmonale: Secondary | ICD-10-CM | POA: Diagnosis not present

## 2018-01-09 DIAGNOSIS — C801 Malignant (primary) neoplasm, unspecified: Secondary | ICD-10-CM | POA: Diagnosis not present

## 2018-01-09 DIAGNOSIS — I10 Essential (primary) hypertension: Secondary | ICD-10-CM | POA: Diagnosis not present

## 2018-01-09 DIAGNOSIS — C786 Secondary malignant neoplasm of retroperitoneum and peritoneum: Secondary | ICD-10-CM | POA: Diagnosis not present

## 2018-01-09 DIAGNOSIS — K219 Gastro-esophageal reflux disease without esophagitis: Secondary | ICD-10-CM | POA: Diagnosis not present

## 2018-01-09 DIAGNOSIS — F329 Major depressive disorder, single episode, unspecified: Secondary | ICD-10-CM | POA: Diagnosis not present

## 2018-01-09 DIAGNOSIS — Z86718 Personal history of other venous thrombosis and embolism: Secondary | ICD-10-CM | POA: Diagnosis not present

## 2018-01-09 DIAGNOSIS — I81 Portal vein thrombosis: Secondary | ICD-10-CM | POA: Diagnosis not present

## 2018-01-09 DIAGNOSIS — I864 Gastric varices: Secondary | ICD-10-CM | POA: Diagnosis not present

## 2018-01-09 DIAGNOSIS — F411 Generalized anxiety disorder: Secondary | ICD-10-CM | POA: Diagnosis not present

## 2018-01-09 DIAGNOSIS — Z86711 Personal history of pulmonary embolism: Secondary | ICD-10-CM | POA: Diagnosis not present

## 2018-01-09 DIAGNOSIS — Z6841 Body Mass Index (BMI) 40.0 and over, adult: Secondary | ICD-10-CM | POA: Diagnosis not present

## 2018-01-09 DIAGNOSIS — Z87891 Personal history of nicotine dependence: Secondary | ICD-10-CM | POA: Diagnosis not present

## 2018-01-09 DIAGNOSIS — E279 Disorder of adrenal gland, unspecified: Secondary | ICD-10-CM | POA: Diagnosis not present

## 2018-01-09 DIAGNOSIS — C562 Malignant neoplasm of left ovary: Secondary | ICD-10-CM | POA: Diagnosis not present

## 2018-01-09 DIAGNOSIS — Z9049 Acquired absence of other specified parts of digestive tract: Secondary | ICD-10-CM | POA: Diagnosis not present

## 2018-01-09 DIAGNOSIS — G629 Polyneuropathy, unspecified: Secondary | ICD-10-CM | POA: Diagnosis not present

## 2018-01-10 DIAGNOSIS — C562 Malignant neoplasm of left ovary: Secondary | ICD-10-CM | POA: Diagnosis not present

## 2018-01-10 DIAGNOSIS — R1900 Intra-abdominal and pelvic swelling, mass and lump, unspecified site: Secondary | ICD-10-CM | POA: Diagnosis not present

## 2018-01-10 DIAGNOSIS — N390 Urinary tract infection, site not specified: Secondary | ICD-10-CM | POA: Diagnosis not present

## 2018-01-17 DIAGNOSIS — C562 Malignant neoplasm of left ovary: Secondary | ICD-10-CM | POA: Diagnosis not present

## 2018-01-17 DIAGNOSIS — I10 Essential (primary) hypertension: Secondary | ICD-10-CM | POA: Diagnosis not present

## 2018-01-17 DIAGNOSIS — F411 Generalized anxiety disorder: Secondary | ICD-10-CM | POA: Diagnosis not present

## 2018-01-17 DIAGNOSIS — Z87891 Personal history of nicotine dependence: Secondary | ICD-10-CM | POA: Diagnosis not present

## 2018-01-17 DIAGNOSIS — C786 Secondary malignant neoplasm of retroperitoneum and peritoneum: Secondary | ICD-10-CM | POA: Diagnosis not present

## 2018-01-17 DIAGNOSIS — Z86711 Personal history of pulmonary embolism: Secondary | ICD-10-CM | POA: Diagnosis not present

## 2018-01-17 DIAGNOSIS — K219 Gastro-esophageal reflux disease without esophagitis: Secondary | ICD-10-CM | POA: Diagnosis not present

## 2018-01-17 DIAGNOSIS — G8918 Other acute postprocedural pain: Secondary | ICD-10-CM | POA: Diagnosis not present

## 2018-01-17 DIAGNOSIS — Z86718 Personal history of other venous thrombosis and embolism: Secondary | ICD-10-CM | POA: Diagnosis not present

## 2018-01-17 DIAGNOSIS — Z882 Allergy status to sulfonamides status: Secondary | ICD-10-CM | POA: Diagnosis not present

## 2018-01-17 DIAGNOSIS — Z6841 Body Mass Index (BMI) 40.0 and over, adult: Secondary | ICD-10-CM | POA: Diagnosis not present

## 2018-01-19 DIAGNOSIS — C786 Secondary malignant neoplasm of retroperitoneum and peritoneum: Secondary | ICD-10-CM | POA: Diagnosis not present

## 2018-01-19 DIAGNOSIS — C562 Malignant neoplasm of left ovary: Secondary | ICD-10-CM | POA: Diagnosis not present

## 2018-01-19 DIAGNOSIS — C5702 Malignant neoplasm of left fallopian tube: Secondary | ICD-10-CM | POA: Diagnosis not present

## 2018-01-19 DIAGNOSIS — Z6841 Body Mass Index (BMI) 40.0 and over, adult: Secondary | ICD-10-CM | POA: Diagnosis not present

## 2018-01-19 DIAGNOSIS — C7962 Secondary malignant neoplasm of left ovary: Secondary | ICD-10-CM | POA: Diagnosis not present

## 2018-01-19 DIAGNOSIS — G8918 Other acute postprocedural pain: Secondary | ICD-10-CM | POA: Diagnosis not present

## 2018-01-19 DIAGNOSIS — C7961 Secondary malignant neoplasm of right ovary: Secondary | ICD-10-CM | POA: Diagnosis not present

## 2018-01-20 DIAGNOSIS — I2699 Other pulmonary embolism without acute cor pulmonale: Secondary | ICD-10-CM | POA: Diagnosis not present

## 2018-01-20 DIAGNOSIS — G8918 Other acute postprocedural pain: Secondary | ICD-10-CM | POA: Diagnosis not present

## 2018-01-21 DIAGNOSIS — G8918 Other acute postprocedural pain: Secondary | ICD-10-CM | POA: Diagnosis not present

## 2018-01-27 DIAGNOSIS — R2689 Other abnormalities of gait and mobility: Secondary | ICD-10-CM | POA: Diagnosis not present

## 2018-02-07 DIAGNOSIS — C562 Malignant neoplasm of left ovary: Secondary | ICD-10-CM | POA: Diagnosis not present

## 2018-02-07 DIAGNOSIS — Z9889 Other specified postprocedural states: Secondary | ICD-10-CM | POA: Diagnosis not present

## 2018-02-07 DIAGNOSIS — Z87891 Personal history of nicotine dependence: Secondary | ICD-10-CM | POA: Diagnosis not present

## 2018-02-07 DIAGNOSIS — G629 Polyneuropathy, unspecified: Secondary | ICD-10-CM | POA: Diagnosis not present

## 2018-02-07 DIAGNOSIS — Z23 Encounter for immunization: Secondary | ICD-10-CM | POA: Diagnosis not present

## 2018-02-14 DIAGNOSIS — C5702 Malignant neoplasm of left fallopian tube: Secondary | ICD-10-CM | POA: Diagnosis not present

## 2018-02-14 DIAGNOSIS — R112 Nausea with vomiting, unspecified: Secondary | ICD-10-CM | POA: Diagnosis not present

## 2018-02-14 DIAGNOSIS — Z90722 Acquired absence of ovaries, bilateral: Secondary | ICD-10-CM | POA: Diagnosis not present

## 2018-02-14 DIAGNOSIS — C562 Malignant neoplasm of left ovary: Secondary | ICD-10-CM | POA: Diagnosis not present

## 2018-02-14 DIAGNOSIS — R5383 Other fatigue: Secondary | ICD-10-CM | POA: Diagnosis not present

## 2018-02-14 DIAGNOSIS — Z5112 Encounter for antineoplastic immunotherapy: Secondary | ICD-10-CM | POA: Diagnosis not present

## 2018-02-14 DIAGNOSIS — G629 Polyneuropathy, unspecified: Secondary | ICD-10-CM | POA: Diagnosis not present

## 2018-02-14 DIAGNOSIS — F411 Generalized anxiety disorder: Secondary | ICD-10-CM | POA: Diagnosis not present

## 2018-02-14 DIAGNOSIS — R399 Unspecified symptoms and signs involving the genitourinary system: Secondary | ICD-10-CM | POA: Diagnosis not present

## 2018-02-14 DIAGNOSIS — T7840XD Allergy, unspecified, subsequent encounter: Secondary | ICD-10-CM | POA: Diagnosis not present

## 2018-02-14 DIAGNOSIS — F329 Major depressive disorder, single episode, unspecified: Secondary | ICD-10-CM | POA: Diagnosis not present

## 2018-02-14 DIAGNOSIS — Z006 Encounter for examination for normal comparison and control in clinical research program: Secondary | ICD-10-CM | POA: Diagnosis not present

## 2018-02-14 DIAGNOSIS — T451X5A Adverse effect of antineoplastic and immunosuppressive drugs, initial encounter: Secondary | ICD-10-CM | POA: Diagnosis not present

## 2018-02-14 DIAGNOSIS — Z5111 Encounter for antineoplastic chemotherapy: Secondary | ICD-10-CM | POA: Diagnosis not present

## 2018-02-14 DIAGNOSIS — I2699 Other pulmonary embolism without acute cor pulmonale: Secondary | ICD-10-CM | POA: Diagnosis not present

## 2018-02-14 DIAGNOSIS — R110 Nausea: Secondary | ICD-10-CM | POA: Diagnosis not present

## 2018-02-14 DIAGNOSIS — C561 Malignant neoplasm of right ovary: Secondary | ICD-10-CM | POA: Diagnosis not present

## 2018-02-14 DIAGNOSIS — I10 Essential (primary) hypertension: Secondary | ICD-10-CM | POA: Diagnosis not present

## 2018-02-22 DIAGNOSIS — I10 Essential (primary) hypertension: Secondary | ICD-10-CM | POA: Diagnosis not present

## 2018-02-23 DIAGNOSIS — Z5181 Encounter for therapeutic drug level monitoring: Secondary | ICD-10-CM | POA: Diagnosis not present

## 2018-02-23 DIAGNOSIS — I2699 Other pulmonary embolism without acute cor pulmonale: Secondary | ICD-10-CM | POA: Diagnosis not present

## 2018-03-07 DIAGNOSIS — R5383 Other fatigue: Secondary | ICD-10-CM | POA: Diagnosis not present

## 2018-03-07 DIAGNOSIS — C786 Secondary malignant neoplasm of retroperitoneum and peritoneum: Secondary | ICD-10-CM | POA: Diagnosis not present

## 2018-03-07 DIAGNOSIS — C562 Malignant neoplasm of left ovary: Secondary | ICD-10-CM | POA: Diagnosis not present

## 2018-03-07 DIAGNOSIS — R399 Unspecified symptoms and signs involving the genitourinary system: Secondary | ICD-10-CM | POA: Diagnosis not present

## 2018-03-07 DIAGNOSIS — N390 Urinary tract infection, site not specified: Secondary | ICD-10-CM | POA: Diagnosis not present

## 2018-03-07 DIAGNOSIS — G620 Drug-induced polyneuropathy: Secondary | ICD-10-CM | POA: Diagnosis not present

## 2018-03-07 DIAGNOSIS — F411 Generalized anxiety disorder: Secondary | ICD-10-CM | POA: Diagnosis not present

## 2018-03-07 DIAGNOSIS — T451X5A Adverse effect of antineoplastic and immunosuppressive drugs, initial encounter: Secondary | ICD-10-CM | POA: Diagnosis not present

## 2018-03-07 DIAGNOSIS — T7840XD Allergy, unspecified, subsequent encounter: Secondary | ICD-10-CM | POA: Diagnosis not present

## 2018-03-07 DIAGNOSIS — Z006 Encounter for examination for normal comparison and control in clinical research program: Secondary | ICD-10-CM | POA: Diagnosis not present

## 2018-03-07 DIAGNOSIS — K5900 Constipation, unspecified: Secondary | ICD-10-CM | POA: Diagnosis not present

## 2018-03-07 DIAGNOSIS — R112 Nausea with vomiting, unspecified: Secondary | ICD-10-CM | POA: Diagnosis not present

## 2018-03-07 DIAGNOSIS — R200 Anesthesia of skin: Secondary | ICD-10-CM | POA: Diagnosis not present

## 2018-03-07 DIAGNOSIS — Z5111 Encounter for antineoplastic chemotherapy: Secondary | ICD-10-CM | POA: Diagnosis not present

## 2018-03-07 DIAGNOSIS — C7989 Secondary malignant neoplasm of other specified sites: Secondary | ICD-10-CM | POA: Diagnosis not present

## 2018-03-07 DIAGNOSIS — Z5112 Encounter for antineoplastic immunotherapy: Secondary | ICD-10-CM | POA: Diagnosis not present

## 2018-03-07 DIAGNOSIS — R51 Headache: Secondary | ICD-10-CM | POA: Diagnosis not present

## 2018-03-07 DIAGNOSIS — T50905D Adverse effect of unspecified drugs, medicaments and biological substances, subsequent encounter: Secondary | ICD-10-CM | POA: Diagnosis not present

## 2018-03-28 DIAGNOSIS — C562 Malignant neoplasm of left ovary: Secondary | ICD-10-CM | POA: Diagnosis not present

## 2018-03-28 DIAGNOSIS — Z87891 Personal history of nicotine dependence: Secondary | ICD-10-CM | POA: Diagnosis not present

## 2018-03-28 DIAGNOSIS — Z86718 Personal history of other venous thrombosis and embolism: Secondary | ICD-10-CM | POA: Diagnosis not present

## 2018-03-28 DIAGNOSIS — G620 Drug-induced polyneuropathy: Secondary | ICD-10-CM | POA: Diagnosis not present

## 2018-03-28 DIAGNOSIS — E279 Disorder of adrenal gland, unspecified: Secondary | ICD-10-CM | POA: Diagnosis not present

## 2018-03-28 DIAGNOSIS — T451X5D Adverse effect of antineoplastic and immunosuppressive drugs, subsequent encounter: Secondary | ICD-10-CM | POA: Diagnosis not present

## 2018-03-28 DIAGNOSIS — Z006 Encounter for examination for normal comparison and control in clinical research program: Secondary | ICD-10-CM | POA: Diagnosis not present

## 2018-03-28 DIAGNOSIS — D61810 Antineoplastic chemotherapy induced pancytopenia: Secondary | ICD-10-CM | POA: Diagnosis not present

## 2018-03-28 DIAGNOSIS — Z79899 Other long term (current) drug therapy: Secondary | ICD-10-CM | POA: Diagnosis not present

## 2018-03-28 DIAGNOSIS — Z5111 Encounter for antineoplastic chemotherapy: Secondary | ICD-10-CM | POA: Diagnosis not present

## 2018-03-28 DIAGNOSIS — I81 Portal vein thrombosis: Secondary | ICD-10-CM | POA: Diagnosis not present

## 2018-03-30 ENCOUNTER — Other Ambulatory Visit: Payer: Self-pay

## 2018-03-30 DIAGNOSIS — Z006 Encounter for examination for normal comparison and control in clinical research program: Secondary | ICD-10-CM

## 2018-04-03 ENCOUNTER — Inpatient Hospital Stay: Payer: Federal, State, Local not specified - PPO | Attending: Obstetrics and Gynecology

## 2018-04-03 ENCOUNTER — Other Ambulatory Visit
Admission: RE | Admit: 2018-04-03 | Discharge: 2018-04-03 | Disposition: A | Discharge status: Home / Self Care | Payer: Federal, State, Local not specified - PPO | Source: Ambulatory Visit | Attending: Obstetrics and Gynecology | Admitting: Obstetrics and Gynecology

## 2018-04-03 DIAGNOSIS — Z006 Encounter for examination for normal comparison and control in clinical research program: Secondary | ICD-10-CM | POA: Diagnosis not present

## 2018-04-03 LAB — CBC WITH DIFFERENTIAL/PLATELET
ABS IMMATURE GRANULOCYTES: 0.01 10*3/uL (ref 0.00–0.07)
BASOS ABS: 0 10*3/uL (ref 0.0–0.1)
Basophils Relative: 0 %
Eosinophils Absolute: 0 10*3/uL (ref 0.0–0.5)
Eosinophils Relative: 0 %
HCT: 33.7 % — ABNORMAL LOW (ref 36.0–46.0)
HEMOGLOBIN: 11.1 g/dL — AB (ref 12.0–15.0)
Immature Granulocytes: 0 %
LYMPHS PCT: 28 %
Lymphs Abs: 0.7 10*3/uL (ref 0.7–4.0)
MCH: 31.2 pg (ref 26.0–34.0)
MCHC: 32.9 g/dL (ref 30.0–36.0)
MCV: 94.7 fL (ref 80.0–100.0)
Monocytes Absolute: 0.5 10*3/uL (ref 0.1–1.0)
Monocytes Relative: 19 %
NEUTROS ABS: 1.3 10*3/uL — AB (ref 1.7–7.7)
NRBC: 0 % (ref 0.0–0.2)
Neutrophils Relative %: 53 %
Platelets: 182 10*3/uL (ref 150–400)
RBC: 3.56 MIL/uL — AB (ref 3.87–5.11)
RDW: 19.9 % — ABNORMAL HIGH (ref 11.5–15.5)
WBC: 2.5 10*3/uL — AB (ref 4.0–10.5)

## 2018-04-10 DIAGNOSIS — Z90722 Acquired absence of ovaries, bilateral: Secondary | ICD-10-CM | POA: Diagnosis not present

## 2018-04-10 DIAGNOSIS — Z5112 Encounter for antineoplastic immunotherapy: Secondary | ICD-10-CM | POA: Diagnosis not present

## 2018-04-10 DIAGNOSIS — R399 Unspecified symptoms and signs involving the genitourinary system: Secondary | ICD-10-CM | POA: Diagnosis not present

## 2018-04-10 DIAGNOSIS — Z79899 Other long term (current) drug therapy: Secondary | ICD-10-CM | POA: Diagnosis not present

## 2018-04-10 DIAGNOSIS — I81 Portal vein thrombosis: Secondary | ICD-10-CM | POA: Diagnosis not present

## 2018-04-10 DIAGNOSIS — Z90710 Acquired absence of both cervix and uterus: Secondary | ICD-10-CM | POA: Diagnosis not present

## 2018-04-10 DIAGNOSIS — F411 Generalized anxiety disorder: Secondary | ICD-10-CM | POA: Diagnosis not present

## 2018-04-10 DIAGNOSIS — E278 Other specified disorders of adrenal gland: Secondary | ICD-10-CM | POA: Diagnosis not present

## 2018-04-10 DIAGNOSIS — R112 Nausea with vomiting, unspecified: Secondary | ICD-10-CM | POA: Diagnosis not present

## 2018-04-10 DIAGNOSIS — T451X5A Adverse effect of antineoplastic and immunosuppressive drugs, initial encounter: Secondary | ICD-10-CM | POA: Diagnosis not present

## 2018-04-10 DIAGNOSIS — Z87891 Personal history of nicotine dependence: Secondary | ICD-10-CM | POA: Diagnosis not present

## 2018-04-10 DIAGNOSIS — Z86718 Personal history of other venous thrombosis and embolism: Secondary | ICD-10-CM | POA: Diagnosis not present

## 2018-04-10 DIAGNOSIS — Z5111 Encounter for antineoplastic chemotherapy: Secondary | ICD-10-CM | POA: Diagnosis not present

## 2018-04-10 DIAGNOSIS — C577 Malignant neoplasm of other specified female genital organs: Secondary | ICD-10-CM | POA: Diagnosis not present

## 2018-04-10 DIAGNOSIS — G620 Drug-induced polyneuropathy: Secondary | ICD-10-CM | POA: Diagnosis not present

## 2018-04-10 DIAGNOSIS — Z006 Encounter for examination for normal comparison and control in clinical research program: Secondary | ICD-10-CM | POA: Diagnosis not present

## 2018-04-10 DIAGNOSIS — C562 Malignant neoplasm of left ovary: Secondary | ICD-10-CM | POA: Diagnosis not present

## 2018-04-24 DIAGNOSIS — D699 Hemorrhagic condition, unspecified: Secondary | ICD-10-CM | POA: Diagnosis not present

## 2018-04-27 ENCOUNTER — Other Ambulatory Visit
Admission: RE | Admit: 2018-04-27 | Discharge: 2018-04-27 | Disposition: A | Discharge status: Home / Self Care | Payer: Federal, State, Local not specified - PPO | Source: Ambulatory Visit | Attending: Obstetrics and Gynecology | Admitting: Obstetrics and Gynecology

## 2018-04-27 DIAGNOSIS — D61811 Other drug-induced pancytopenia: Secondary | ICD-10-CM | POA: Diagnosis not present

## 2018-04-27 DIAGNOSIS — D6959 Other secondary thrombocytopenia: Secondary | ICD-10-CM | POA: Diagnosis not present

## 2018-04-27 LAB — CBC WITH DIFFERENTIAL/PLATELET
ABS IMMATURE GRANULOCYTES: 0.01 10*3/uL (ref 0.00–0.07)
Basophils Absolute: 0 10*3/uL (ref 0.0–0.1)
Basophils Relative: 0 %
Eosinophils Absolute: 0 10*3/uL (ref 0.0–0.5)
Eosinophils Relative: 0 %
HCT: 31 % — ABNORMAL LOW (ref 36.0–46.0)
HEMOGLOBIN: 10.2 g/dL — AB (ref 12.0–15.0)
IMMATURE GRANULOCYTES: 0 %
LYMPHS PCT: 38 %
Lymphs Abs: 0.9 10*3/uL (ref 0.7–4.0)
MCH: 30.9 pg (ref 26.0–34.0)
MCHC: 32.9 g/dL (ref 30.0–36.0)
MCV: 93.9 fL (ref 80.0–100.0)
MONO ABS: 0.3 10*3/uL (ref 0.1–1.0)
MONOS PCT: 13 %
NEUTROS ABS: 1.1 10*3/uL — AB (ref 1.7–7.7)
NEUTROS PCT: 49 %
Platelets: 33 10*3/uL — ABNORMAL LOW (ref 150–400)
RBC: 3.3 MIL/uL — ABNORMAL LOW (ref 3.87–5.11)
RDW: 19.7 % — ABNORMAL HIGH (ref 11.5–15.5)
WBC: 2.3 10*3/uL — ABNORMAL LOW (ref 4.0–10.5)
nRBC: 0 % (ref 0.0–0.2)

## 2018-04-28 ENCOUNTER — Other Ambulatory Visit: Payer: Self-pay

## 2018-04-28 DIAGNOSIS — Z006 Encounter for examination for normal comparison and control in clinical research program: Secondary | ICD-10-CM

## 2018-04-28 DIAGNOSIS — E279 Disorder of adrenal gland, unspecified: Secondary | ICD-10-CM | POA: Diagnosis not present

## 2018-04-28 DIAGNOSIS — C562 Malignant neoplasm of left ovary: Secondary | ICD-10-CM | POA: Diagnosis not present

## 2018-04-28 DIAGNOSIS — C786 Secondary malignant neoplasm of retroperitoneum and peritoneum: Secondary | ICD-10-CM | POA: Diagnosis not present

## 2018-04-28 DIAGNOSIS — I2699 Other pulmonary embolism without acute cor pulmonale: Secondary | ICD-10-CM | POA: Diagnosis not present

## 2018-05-01 ENCOUNTER — Other Ambulatory Visit
Admission: RE | Admit: 2018-05-01 | Discharge: 2018-05-01 | Disposition: A | Discharge status: Home / Self Care | Payer: Federal, State, Local not specified - PPO | Attending: Obstetrics and Gynecology | Admitting: Obstetrics and Gynecology

## 2018-05-01 ENCOUNTER — Inpatient Hospital Stay: Payer: Federal, State, Local not specified - PPO | Attending: Obstetrics and Gynecology

## 2018-05-01 DIAGNOSIS — Z006 Encounter for examination for normal comparison and control in clinical research program: Secondary | ICD-10-CM | POA: Diagnosis not present

## 2018-05-01 LAB — CBC WITH DIFFERENTIAL/PLATELET
ABS IMMATURE GRANULOCYTES: 0.01 10*3/uL (ref 0.00–0.07)
BASOS PCT: 0 %
Basophils Absolute: 0 10*3/uL (ref 0.0–0.1)
EOS ABS: 0 10*3/uL (ref 0.0–0.5)
EOS PCT: 0 %
HCT: 30.9 % — ABNORMAL LOW (ref 36.0–46.0)
HEMOGLOBIN: 10.2 g/dL — AB (ref 12.0–15.0)
Immature Granulocytes: 0 %
Lymphocytes Relative: 31 %
Lymphs Abs: 0.9 10*3/uL (ref 0.7–4.0)
MCH: 31.4 pg (ref 26.0–34.0)
MCHC: 33 g/dL (ref 30.0–36.0)
MCV: 95.1 fL (ref 80.0–100.0)
MONO ABS: 0.3 10*3/uL (ref 0.1–1.0)
MONOS PCT: 11 %
NEUTROS PCT: 58 %
Neutro Abs: 1.6 10*3/uL — ABNORMAL LOW (ref 1.7–7.7)
PLATELETS: 48 10*3/uL — AB (ref 150–400)
RBC: 3.25 MIL/uL — ABNORMAL LOW (ref 3.87–5.11)
RDW: 20.1 % — ABNORMAL HIGH (ref 11.5–15.5)
WBC: 2.8 10*3/uL — ABNORMAL LOW (ref 4.0–10.5)
nRBC: 0 % (ref 0.0–0.2)

## 2018-05-02 DIAGNOSIS — Z5112 Encounter for antineoplastic immunotherapy: Secondary | ICD-10-CM | POA: Diagnosis not present

## 2018-05-02 DIAGNOSIS — Z006 Encounter for examination for normal comparison and control in clinical research program: Secondary | ICD-10-CM | POA: Diagnosis not present

## 2018-05-02 DIAGNOSIS — C562 Malignant neoplasm of left ovary: Secondary | ICD-10-CM | POA: Diagnosis not present

## 2018-05-02 DIAGNOSIS — C786 Secondary malignant neoplasm of retroperitoneum and peritoneum: Secondary | ICD-10-CM | POA: Diagnosis not present

## 2018-05-02 DIAGNOSIS — C561 Malignant neoplasm of right ovary: Secondary | ICD-10-CM | POA: Diagnosis not present

## 2018-05-02 DIAGNOSIS — C7989 Secondary malignant neoplasm of other specified sites: Secondary | ICD-10-CM | POA: Diagnosis not present

## 2018-05-02 DIAGNOSIS — D696 Thrombocytopenia, unspecified: Secondary | ICD-10-CM | POA: Diagnosis not present

## 2018-05-02 DIAGNOSIS — T451X5A Adverse effect of antineoplastic and immunosuppressive drugs, initial encounter: Secondary | ICD-10-CM | POA: Diagnosis not present

## 2018-05-02 DIAGNOSIS — R112 Nausea with vomiting, unspecified: Secondary | ICD-10-CM | POA: Diagnosis not present

## 2018-05-02 DIAGNOSIS — C5702 Malignant neoplasm of left fallopian tube: Secondary | ICD-10-CM | POA: Diagnosis not present

## 2018-05-02 DIAGNOSIS — F419 Anxiety disorder, unspecified: Secondary | ICD-10-CM | POA: Diagnosis not present

## 2018-05-02 DIAGNOSIS — T7840XD Allergy, unspecified, subsequent encounter: Secondary | ICD-10-CM | POA: Diagnosis not present

## 2018-05-02 DIAGNOSIS — Z5111 Encounter for antineoplastic chemotherapy: Secondary | ICD-10-CM | POA: Diagnosis not present

## 2018-05-02 DIAGNOSIS — Z90710 Acquired absence of both cervix and uterus: Secondary | ICD-10-CM | POA: Diagnosis not present

## 2018-05-02 DIAGNOSIS — I2699 Other pulmonary embolism without acute cor pulmonale: Secondary | ICD-10-CM | POA: Diagnosis not present

## 2018-05-02 DIAGNOSIS — Z90722 Acquired absence of ovaries, bilateral: Secondary | ICD-10-CM | POA: Diagnosis not present

## 2018-05-11 ENCOUNTER — Other Ambulatory Visit: Payer: Self-pay | Admitting: Obstetrics and Gynecology

## 2018-05-11 DIAGNOSIS — F32A Depression, unspecified: Secondary | ICD-10-CM

## 2018-05-11 DIAGNOSIS — F329 Major depressive disorder, single episode, unspecified: Secondary | ICD-10-CM

## 2018-05-11 DIAGNOSIS — F419 Anxiety disorder, unspecified: Principal | ICD-10-CM

## 2018-05-11 MED ORDER — SERTRALINE HCL 100 MG PO TABS: 100.0000 mg | ORAL_TABLET | Freq: Every day | ORAL | 3 refills | Status: AC

## 2018-05-11 NOTE — Progress Notes (Signed)
Rx RF sertraline for a yr. Pt not seen in close to a year due to current ovarian cancer treatment at [facility].

## 2018-05-22 DIAGNOSIS — J209 Acute bronchitis, unspecified: Secondary | ICD-10-CM | POA: Diagnosis not present

## 2018-05-22 DIAGNOSIS — R05 Cough: Secondary | ICD-10-CM | POA: Diagnosis not present

## 2018-05-29 DIAGNOSIS — I2699 Other pulmonary embolism without acute cor pulmonale: Secondary | ICD-10-CM | POA: Diagnosis not present

## 2018-05-29 DIAGNOSIS — Z7901 Long term (current) use of anticoagulants: Secondary | ICD-10-CM | POA: Diagnosis not present

## 2018-05-29 DIAGNOSIS — Z5181 Encounter for therapeutic drug level monitoring: Secondary | ICD-10-CM | POA: Diagnosis not present

## 2018-05-30 DIAGNOSIS — Z90722 Acquired absence of ovaries, bilateral: Secondary | ICD-10-CM | POA: Diagnosis not present

## 2018-05-30 DIAGNOSIS — Z006 Encounter for examination for normal comparison and control in clinical research program: Secondary | ICD-10-CM | POA: Diagnosis not present

## 2018-05-30 DIAGNOSIS — Z87891 Personal history of nicotine dependence: Secondary | ICD-10-CM | POA: Diagnosis not present

## 2018-05-30 DIAGNOSIS — C562 Malignant neoplasm of left ovary: Secondary | ICD-10-CM | POA: Diagnosis not present

## 2018-05-30 DIAGNOSIS — R112 Nausea with vomiting, unspecified: Secondary | ICD-10-CM | POA: Diagnosis not present

## 2018-05-30 DIAGNOSIS — C786 Secondary malignant neoplasm of retroperitoneum and peritoneum: Secondary | ICD-10-CM | POA: Diagnosis not present

## 2018-05-30 DIAGNOSIS — F419 Anxiety disorder, unspecified: Secondary | ICD-10-CM | POA: Diagnosis not present

## 2018-05-30 DIAGNOSIS — Z5112 Encounter for antineoplastic immunotherapy: Secondary | ICD-10-CM | POA: Diagnosis not present

## 2018-05-30 DIAGNOSIS — Z90710 Acquired absence of both cervix and uterus: Secondary | ICD-10-CM | POA: Diagnosis not present

## 2018-05-30 DIAGNOSIS — D696 Thrombocytopenia, unspecified: Secondary | ICD-10-CM | POA: Diagnosis not present

## 2018-05-30 DIAGNOSIS — T451X5A Adverse effect of antineoplastic and immunosuppressive drugs, initial encounter: Secondary | ICD-10-CM | POA: Diagnosis not present

## 2018-05-30 DIAGNOSIS — E669 Obesity, unspecified: Secondary | ICD-10-CM | POA: Diagnosis not present

## 2018-05-30 DIAGNOSIS — R5383 Other fatigue: Secondary | ICD-10-CM | POA: Diagnosis not present

## 2018-05-30 DIAGNOSIS — I2699 Other pulmonary embolism without acute cor pulmonale: Secondary | ICD-10-CM | POA: Diagnosis not present

## 2018-05-30 DIAGNOSIS — G620 Drug-induced polyneuropathy: Secondary | ICD-10-CM | POA: Diagnosis not present

## 2018-05-30 DIAGNOSIS — Z9889 Other specified postprocedural states: Secondary | ICD-10-CM | POA: Diagnosis not present

## 2018-06-20 DIAGNOSIS — Z9079 Acquired absence of other genital organ(s): Secondary | ICD-10-CM | POA: Diagnosis not present

## 2018-06-20 DIAGNOSIS — Z7901 Long term (current) use of anticoagulants: Secondary | ICD-10-CM | POA: Diagnosis not present

## 2018-06-20 DIAGNOSIS — C562 Malignant neoplasm of left ovary: Secondary | ICD-10-CM | POA: Diagnosis not present

## 2018-06-20 DIAGNOSIS — Z90710 Acquired absence of both cervix and uterus: Secondary | ICD-10-CM | POA: Diagnosis not present

## 2018-06-20 DIAGNOSIS — I2699 Other pulmonary embolism without acute cor pulmonale: Secondary | ICD-10-CM | POA: Diagnosis not present

## 2018-06-20 DIAGNOSIS — G620 Drug-induced polyneuropathy: Secondary | ICD-10-CM | POA: Diagnosis not present

## 2018-06-20 DIAGNOSIS — Z5112 Encounter for antineoplastic immunotherapy: Secondary | ICD-10-CM | POA: Diagnosis not present

## 2018-06-20 DIAGNOSIS — Z9049 Acquired absence of other specified parts of digestive tract: Secondary | ICD-10-CM | POA: Diagnosis not present

## 2018-06-20 DIAGNOSIS — Z90722 Acquired absence of ovaries, bilateral: Secondary | ICD-10-CM | POA: Diagnosis not present

## 2018-06-20 DIAGNOSIS — T7840XD Allergy, unspecified, subsequent encounter: Secondary | ICD-10-CM | POA: Diagnosis not present

## 2018-06-20 DIAGNOSIS — Z6841 Body Mass Index (BMI) 40.0 and over, adult: Secondary | ICD-10-CM | POA: Diagnosis not present

## 2018-06-20 DIAGNOSIS — R112 Nausea with vomiting, unspecified: Secondary | ICD-10-CM | POA: Diagnosis not present

## 2018-06-20 DIAGNOSIS — F419 Anxiety disorder, unspecified: Secondary | ICD-10-CM | POA: Diagnosis not present

## 2018-06-20 DIAGNOSIS — R918 Other nonspecific abnormal finding of lung field: Secondary | ICD-10-CM | POA: Diagnosis not present

## 2018-06-20 DIAGNOSIS — Z006 Encounter for examination for normal comparison and control in clinical research program: Secondary | ICD-10-CM | POA: Diagnosis not present

## 2018-06-20 DIAGNOSIS — R5383 Other fatigue: Secondary | ICD-10-CM | POA: Diagnosis not present

## 2018-06-20 DIAGNOSIS — T451X5A Adverse effect of antineoplastic and immunosuppressive drugs, initial encounter: Secondary | ICD-10-CM | POA: Diagnosis not present

## 2018-06-20 DIAGNOSIS — I81 Portal vein thrombosis: Secondary | ICD-10-CM | POA: Diagnosis not present

## 2018-07-11 DIAGNOSIS — F329 Major depressive disorder, single episode, unspecified: Secondary | ICD-10-CM | POA: Diagnosis not present

## 2018-07-11 DIAGNOSIS — D696 Thrombocytopenia, unspecified: Secondary | ICD-10-CM | POA: Diagnosis not present

## 2018-07-11 DIAGNOSIS — C786 Secondary malignant neoplasm of retroperitoneum and peritoneum: Secondary | ICD-10-CM | POA: Diagnosis not present

## 2018-07-11 DIAGNOSIS — R112 Nausea with vomiting, unspecified: Secondary | ICD-10-CM | POA: Diagnosis not present

## 2018-07-11 DIAGNOSIS — Z79899 Other long term (current) drug therapy: Secondary | ICD-10-CM | POA: Diagnosis not present

## 2018-07-11 DIAGNOSIS — C562 Malignant neoplasm of left ovary: Secondary | ICD-10-CM | POA: Diagnosis not present

## 2018-07-11 DIAGNOSIS — Z5112 Encounter for antineoplastic immunotherapy: Secondary | ICD-10-CM | POA: Diagnosis not present

## 2018-07-11 DIAGNOSIS — T451X5A Adverse effect of antineoplastic and immunosuppressive drugs, initial encounter: Secondary | ICD-10-CM | POA: Diagnosis not present

## 2018-07-11 DIAGNOSIS — F419 Anxiety disorder, unspecified: Secondary | ICD-10-CM | POA: Diagnosis not present

## 2018-07-11 DIAGNOSIS — Z7901 Long term (current) use of anticoagulants: Secondary | ICD-10-CM | POA: Diagnosis not present

## 2018-07-11 DIAGNOSIS — T7840XD Allergy, unspecified, subsequent encounter: Secondary | ICD-10-CM | POA: Diagnosis not present

## 2018-07-11 DIAGNOSIS — Z8669 Personal history of other diseases of the nervous system and sense organs: Secondary | ICD-10-CM | POA: Diagnosis not present

## 2018-07-11 DIAGNOSIS — Z006 Encounter for examination for normal comparison and control in clinical research program: Secondary | ICD-10-CM | POA: Diagnosis not present

## 2018-07-11 DIAGNOSIS — I2699 Other pulmonary embolism without acute cor pulmonale: Secondary | ICD-10-CM | POA: Diagnosis not present

## 2018-07-11 DIAGNOSIS — I10 Essential (primary) hypertension: Secondary | ICD-10-CM | POA: Diagnosis not present

## 2018-07-17 DIAGNOSIS — D699 Hemorrhagic condition, unspecified: Secondary | ICD-10-CM | POA: Diagnosis not present

## 2018-07-31 DIAGNOSIS — Z006 Encounter for examination for normal comparison and control in clinical research program: Secondary | ICD-10-CM | POA: Diagnosis not present

## 2018-07-31 DIAGNOSIS — R590 Localized enlarged lymph nodes: Secondary | ICD-10-CM | POA: Diagnosis not present

## 2018-07-31 DIAGNOSIS — C562 Malignant neoplasm of left ovary: Secondary | ICD-10-CM | POA: Diagnosis not present

## 2018-08-01 DIAGNOSIS — Z006 Encounter for examination for normal comparison and control in clinical research program: Secondary | ICD-10-CM | POA: Diagnosis not present

## 2018-08-01 DIAGNOSIS — C562 Malignant neoplasm of left ovary: Secondary | ICD-10-CM | POA: Diagnosis not present

## 2018-08-03 DIAGNOSIS — Z5112 Encounter for antineoplastic immunotherapy: Secondary | ICD-10-CM | POA: Diagnosis not present

## 2018-08-03 DIAGNOSIS — Z5111 Encounter for antineoplastic chemotherapy: Secondary | ICD-10-CM | POA: Diagnosis not present

## 2018-08-03 DIAGNOSIS — C562 Malignant neoplasm of left ovary: Secondary | ICD-10-CM | POA: Diagnosis not present

## 2018-08-03 DIAGNOSIS — D699 Hemorrhagic condition, unspecified: Secondary | ICD-10-CM | POA: Diagnosis not present

## 2018-08-07 ENCOUNTER — Telehealth: Payer: Self-pay

## 2018-08-07 NOTE — Telephone Encounter (Signed)
Voicemail left with Ms. Hirt to return call. Per Dr. Theora Gianotti:  Rose Dunn [name] is an [facility] patient who was treated at [facility] on a trial. She has progressed. We are starting her on cycle#1 Doxil and bev off protocol. She would like to transfer her care back to [facility]. We can do cycle#1Day#15 bev at [facility]. In the interim can you please arrange for her to see one of the Medical oncologist there for Cycles 2 and 3. Anticipate C#2D#1 on Aug 31, 2018. Can you also set her up to see me after cycle 3.    I will arrange for  Ms. Brabson to see medical oncology the week before her second cycle is due.

## 2018-08-09 ENCOUNTER — Other Ambulatory Visit: Payer: Self-pay

## 2018-08-09 DIAGNOSIS — C562 Malignant neoplasm of left ovary: Secondary | ICD-10-CM

## 2018-08-15 ENCOUNTER — Telehealth: Payer: Self-pay

## 2018-08-15 DIAGNOSIS — Z5112 Encounter for antineoplastic immunotherapy: Secondary | ICD-10-CM | POA: Diagnosis not present

## 2018-08-15 DIAGNOSIS — C562 Malignant neoplasm of left ovary: Secondary | ICD-10-CM | POA: Diagnosis not present

## 2018-08-15 NOTE — Telephone Encounter (Signed)
Voicemail left with Rose Anderson to return call. Message received that she as questions regarding what appointment is for at cancer center.

## 2018-08-16 ENCOUNTER — Telehealth: Payer: Self-pay

## 2018-08-16 NOTE — Telephone Encounter (Signed)
Called and spoke to Rose Anderson. She is extremely happy and wants to keep care at [facility]. Dr. Theora Gianotti notified. Referral cancelled for [facility].

## 2018-08-23 DIAGNOSIS — I10 Essential (primary) hypertension: Secondary | ICD-10-CM | POA: Diagnosis not present

## 2018-08-25 DIAGNOSIS — Z5181 Encounter for therapeutic drug level monitoring: Secondary | ICD-10-CM | POA: Diagnosis not present

## 2018-08-25 DIAGNOSIS — D6959 Other secondary thrombocytopenia: Secondary | ICD-10-CM | POA: Diagnosis not present

## 2018-08-25 DIAGNOSIS — Z86718 Personal history of other venous thrombosis and embolism: Secondary | ICD-10-CM | POA: Diagnosis not present

## 2018-08-25 DIAGNOSIS — I2699 Other pulmonary embolism without acute cor pulmonale: Secondary | ICD-10-CM | POA: Diagnosis not present

## 2018-08-28 DIAGNOSIS — C562 Malignant neoplasm of left ovary: Secondary | ICD-10-CM | POA: Diagnosis not present

## 2018-08-29 DIAGNOSIS — C562 Malignant neoplasm of left ovary: Secondary | ICD-10-CM | POA: Diagnosis not present

## 2018-08-29 DIAGNOSIS — Z5112 Encounter for antineoplastic immunotherapy: Secondary | ICD-10-CM | POA: Diagnosis not present

## 2018-08-29 DIAGNOSIS — Z5111 Encounter for antineoplastic chemotherapy: Secondary | ICD-10-CM | POA: Diagnosis not present

## 2018-08-31 ENCOUNTER — Other Ambulatory Visit: Payer: Self-pay | Admitting: Obstetrics and Gynecology

## 2018-08-31 DIAGNOSIS — F329 Major depressive disorder, single episode, unspecified: Secondary | ICD-10-CM

## 2018-08-31 DIAGNOSIS — F32A Depression, unspecified: Secondary | ICD-10-CM

## 2018-08-31 NOTE — Telephone Encounter (Signed)
Please advise 

## 2018-09-12 DIAGNOSIS — Z5112 Encounter for antineoplastic immunotherapy: Secondary | ICD-10-CM | POA: Diagnosis not present

## 2018-09-12 DIAGNOSIS — C562 Malignant neoplasm of left ovary: Secondary | ICD-10-CM | POA: Diagnosis not present

## 2018-09-22 ENCOUNTER — Emergency Department: Payer: Federal, State, Local not specified - PPO

## 2018-09-22 ENCOUNTER — Emergency Department
Admission: EM | Admit: 2018-09-22 | Discharge: 2018-09-22 | Disposition: A | Discharge status: Home / Self Care | Payer: Federal, State, Local not specified - PPO | Attending: Emergency Medicine | Admitting: Emergency Medicine

## 2018-09-22 ENCOUNTER — Encounter: Payer: Self-pay | Admitting: Emergency Medicine

## 2018-09-22 ENCOUNTER — Other Ambulatory Visit: Payer: Self-pay

## 2018-09-22 DIAGNOSIS — H532 Diplopia: Secondary | ICD-10-CM | POA: Insufficient documentation

## 2018-09-22 DIAGNOSIS — Z8543 Personal history of malignant neoplasm of ovary: Secondary | ICD-10-CM | POA: Insufficient documentation

## 2018-09-22 DIAGNOSIS — Z87891 Personal history of nicotine dependence: Secondary | ICD-10-CM | POA: Diagnosis not present

## 2018-09-22 DIAGNOSIS — H5702 Anisocoria: Secondary | ICD-10-CM | POA: Diagnosis not present

## 2018-09-22 DIAGNOSIS — H5709 Other anomalies of pupillary function: Secondary | ICD-10-CM | POA: Diagnosis not present

## 2018-09-22 NOTE — ED Provider Notes (Signed)
[facility] Emergency Department Provider Note       Time seen: ----------------------------------------- 12:45 PM on 09/22/2018 -----------------------------------------   I have reviewed the triage vital signs and the nursing notes.  HISTORY   Chief Complaint Eye Problem    HPI Rose Anderson is a 58 y.o. female with a history of anxiety, depression, ovarian cancer, DVT, GERD, pulmonary embolism who presents to the ED for right pupillary dilatation.  She noticed her eye this morning was dilated.  She is a cancer patient was told to come to the ER for further evaluation she denies any blurry vision.  She also denies headache.  Past Medical History:  Diagnosis Date  . Anxiety and depression   . Cancer (HCC)    OVARIAN  . DVT (deep venous thrombosis) ([place]) 10/2017   RT common femoral  . GERD (gastroesophageal reflux disease)    TUMS PRN  . History of methicillin resistant staphylococcus aureus (MRSA) 2012  . Ovarian cancer ([place]) 07/2017   treated at [facility]  . Pulmonary embolism ([place]) 10/2017   bilat during ovar ca chemo  . White coat syndrome without hypertension     Patient Active Problem List   Diagnosis Date Noted  . Elevated blood pressure reading in office with white coat syndrome, without diagnosis of hypertension 05/17/2017    Past Surgical History:  Procedure Laterality Date  . CESAREAN SECTION  12/87;8/92;10/97  . CHOLECYSTECTOMY    . CHOLECYSTECTOMY    . TONSILECTOMY/ADENOIDECTOMY WITH MYRINGOTOMY      Allergies Codeine, Sulfa antibiotics, and Tape  Social History Social History   Tobacco Use  . Smoking status: Former Smoker    Packs/day: 0.50    Years: 18.00    Pack years: 9.00    Types: Cigarettes    Quit date: 07/30/2003    Years since quitting: 15.1  . Smokeless tobacco: Never Used  Substance Use Topics  . Alcohol use: No    Frequency: Never  . Drug use: No   Review of Systems Constitutional: Negative for  fever. Eyes: Positive for dilated pupil Cardiovascular: Negative for chest pain. Respiratory: Negative for shortness of breath. Musculoskeletal: Negative for back pain. Skin: Negative for rash. Neurological: Negative for headaches, focal weakness or numbness.  All systems negative/normal/unremarkable except as stated in the HPI  ____________________________________________   PHYSICAL EXAM:  VITAL SIGNS: ED Triage Vitals  Enc Vitals Group     BP 09/22/18 1217 (!) 158/82     Pulse Rate 09/22/18 1217 82     Resp --      Temp 09/22/18 1217 98.3 F (36.8 C)     Temp src --      SpO2 09/22/18 1217 97 %     Weight 09/22/18 1217 218 lb (98.9 kg)     Height 09/22/18 1217 5' 3.5" (1.613 m)     Head Circumference --      Peak Flow --      Pain Score 09/22/18 1224 0     Pain Loc --      Pain Edu? --      Excl. in [place]? --    Constitutional: Alert and oriented. Well appearing and in no distress. Eyes: Right pupil is dilated compared to left, no conjunctival injection, normal extraocular movements.  Pupils are reactive to light and accommodation ENT      Head: Normocephalic and atraumatic.      Nose: No congestion/rhinnorhea.      Mouth/Throat: Mucous membranes are moist.  Neck: No stridor. Cardiovascular: Normal rate, regular rhythm. Respiratory: Normal respiratory effort without tachypnea nor retractions. Musculoskeletal: Nontender with normal range of motion in extremities. No lower extremity tenderness nor edema. Neurologic:  Normal speech and language. No gross focal neurologic deficits are appreciated.  Skin:  Skin is warm, dry and intact. No rash noted. Psychiatric: Mood and affect are normal. Speech and behavior are normal.  ____________________________________________  ED COURSE:  As part of my medical decision making, I reviewed the following data within the [place] History obtained from family if available, nursing notes, old chart and ekg, as  well as notes from prior ED visits. Patient presented for a painless dilated pupil on the right, we will assess with imaging as indicated at this time.   Procedures  Antania RYLIEGH MCDUFFEY was evaluated in Emergency Department on 09/22/2018 for the symptoms described in the history of present illness. She was evaluated in the context of the global COVID-19 pandemic, which necessitated consideration that the patient might be at risk for infection with the SARS-CoV-2 virus that causes COVID-19. Institutional protocols and algorithms that pertain to the evaluation of patients at risk for COVID-19 are in a state of rapid change based on information released by regulatory bodies including the CDC and federal and state organizations. These policies and algorithms were followed during the patient's care in the ED.  ____________________________________________    RADIOLOGY Images were viewed by me  CT head Is unremarkable ____________________________________________   DIFFERENTIAL DIAGNOSIS   Anisocoria, chemotherapy side effect, glaucoma unlikely  FINAL ASSESSMENT AND PLAN  Anisocoria   Plan: The patient had presented for dilated right pupil.  Patient's imaging was unremarkable.  Patient states she was wearing a scopolamine patch and may have touched her hand to her eye.  This seems to be benign or medication related but does not appear to be coming from any acute emergency medical condition.  I discussed with ophthalmology on-call and he agreed she is stable for outpatient follow-up since her CT is negative.   Laurence Aly, MD    Note: This note was generated in part or whole with voice recognition software. Voice recognition is usually quite accurate but there are transcription errors that can and very often do occur. I apologize for any typographical errors that were not detected and corrected.     Earleen Newport, MD 09/22/18 1357

## 2018-09-22 NOTE — ED Notes (Signed)
Pt complains of right eye watering and drainage, itching. Reports noticing a stye in right eye starting on June 7th. Denies pain at this time. Noticed increase in dilation starting today.

## 2018-09-22 NOTE — ED Triage Notes (Signed)
Patient presents to the ED with her right eye dilated.  Patient denies any other symptoms.  Patient noticed her eye this morning.  Patient is a cancer patient at [facility] and was told to come to the ED.  Patient denies any blurry vision, denies headache.

## 2018-09-26 DIAGNOSIS — Z5112 Encounter for antineoplastic immunotherapy: Secondary | ICD-10-CM | POA: Diagnosis not present

## 2018-09-26 DIAGNOSIS — C562 Malignant neoplasm of left ovary: Secondary | ICD-10-CM | POA: Diagnosis not present

## 2018-09-26 DIAGNOSIS — Z5111 Encounter for antineoplastic chemotherapy: Secondary | ICD-10-CM | POA: Diagnosis not present

## 2018-10-10 DIAGNOSIS — C562 Malignant neoplasm of left ovary: Secondary | ICD-10-CM | POA: Diagnosis not present

## 2018-10-10 DIAGNOSIS — Z5112 Encounter for antineoplastic immunotherapy: Secondary | ICD-10-CM | POA: Diagnosis not present

## 2018-10-23 DIAGNOSIS — Z5111 Encounter for antineoplastic chemotherapy: Secondary | ICD-10-CM | POA: Diagnosis not present

## 2018-10-23 DIAGNOSIS — R918 Other nonspecific abnormal finding of lung field: Secondary | ICD-10-CM | POA: Diagnosis not present

## 2018-10-23 DIAGNOSIS — D701 Agranulocytosis secondary to cancer chemotherapy: Secondary | ICD-10-CM | POA: Diagnosis not present

## 2018-10-23 DIAGNOSIS — R188 Other ascites: Secondary | ICD-10-CM | POA: Diagnosis not present

## 2018-10-23 DIAGNOSIS — Z1379 Encounter for other screening for genetic and chromosomal anomalies: Secondary | ICD-10-CM | POA: Diagnosis not present

## 2018-10-23 DIAGNOSIS — T451X5A Adverse effect of antineoplastic and immunosuppressive drugs, initial encounter: Secondary | ICD-10-CM | POA: Diagnosis not present

## 2018-10-23 DIAGNOSIS — C562 Malignant neoplasm of left ovary: Secondary | ICD-10-CM | POA: Diagnosis not present

## 2018-10-24 DIAGNOSIS — Z1379 Encounter for other screening for genetic and chromosomal anomalies: Secondary | ICD-10-CM | POA: Diagnosis not present

## 2018-10-24 DIAGNOSIS — Z5111 Encounter for antineoplastic chemotherapy: Secondary | ICD-10-CM | POA: Diagnosis not present

## 2018-10-24 DIAGNOSIS — D61810 Antineoplastic chemotherapy induced pancytopenia: Secondary | ICD-10-CM | POA: Diagnosis not present

## 2018-10-24 DIAGNOSIS — G620 Drug-induced polyneuropathy: Secondary | ICD-10-CM | POA: Diagnosis not present

## 2018-10-24 DIAGNOSIS — C562 Malignant neoplasm of left ovary: Secondary | ICD-10-CM | POA: Diagnosis not present

## 2018-10-24 DIAGNOSIS — T451X5A Adverse effect of antineoplastic and immunosuppressive drugs, initial encounter: Secondary | ICD-10-CM | POA: Diagnosis not present

## 2018-12-04 DIAGNOSIS — Z5111 Encounter for antineoplastic chemotherapy: Secondary | ICD-10-CM | POA: Diagnosis not present

## 2019-01-02 DIAGNOSIS — Z23 Encounter for immunization: Secondary | ICD-10-CM | POA: Diagnosis not present

## 2019-01-02 DIAGNOSIS — Z5111 Encounter for antineoplastic chemotherapy: Secondary | ICD-10-CM | POA: Diagnosis not present

## 2019-01-02 DIAGNOSIS — T451X5A Adverse effect of antineoplastic and immunosuppressive drugs, initial encounter: Secondary | ICD-10-CM | POA: Diagnosis not present

## 2019-01-02 DIAGNOSIS — R918 Other nonspecific abnormal finding of lung field: Secondary | ICD-10-CM | POA: Diagnosis not present

## 2019-01-02 DIAGNOSIS — I2699 Other pulmonary embolism without acute cor pulmonale: Secondary | ICD-10-CM | POA: Diagnosis not present

## 2019-01-02 DIAGNOSIS — C562 Malignant neoplasm of left ovary: Secondary | ICD-10-CM | POA: Diagnosis not present

## 2019-01-02 DIAGNOSIS — Z8543 Personal history of malignant neoplasm of ovary: Secondary | ICD-10-CM | POA: Diagnosis not present

## 2019-01-02 DIAGNOSIS — R1900 Intra-abdominal and pelvic swelling, mass and lump, unspecified site: Secondary | ICD-10-CM | POA: Diagnosis not present

## 2019-01-02 DIAGNOSIS — I81 Portal vein thrombosis: Secondary | ICD-10-CM | POA: Diagnosis not present

## 2019-01-02 DIAGNOSIS — D61810 Antineoplastic chemotherapy induced pancytopenia: Secondary | ICD-10-CM | POA: Diagnosis not present

## 2019-01-09 DIAGNOSIS — R112 Nausea with vomiting, unspecified: Secondary | ICD-10-CM | POA: Diagnosis not present

## 2019-01-09 DIAGNOSIS — Z006 Encounter for examination for normal comparison and control in clinical research program: Secondary | ICD-10-CM | POA: Diagnosis not present

## 2019-01-09 DIAGNOSIS — I2699 Other pulmonary embolism without acute cor pulmonale: Secondary | ICD-10-CM | POA: Diagnosis not present

## 2019-01-09 DIAGNOSIS — C5702 Malignant neoplasm of left fallopian tube: Secondary | ICD-10-CM | POA: Diagnosis not present

## 2019-01-09 DIAGNOSIS — F419 Anxiety disorder, unspecified: Secondary | ICD-10-CM | POA: Diagnosis not present

## 2019-01-09 DIAGNOSIS — C561 Malignant neoplasm of right ovary: Secondary | ICD-10-CM | POA: Diagnosis not present

## 2019-01-09 DIAGNOSIS — Z90722 Acquired absence of ovaries, bilateral: Secondary | ICD-10-CM | POA: Diagnosis not present

## 2019-01-09 DIAGNOSIS — T451X5A Adverse effect of antineoplastic and immunosuppressive drugs, initial encounter: Secondary | ICD-10-CM | POA: Diagnosis not present

## 2019-01-09 DIAGNOSIS — G629 Polyneuropathy, unspecified: Secondary | ICD-10-CM | POA: Diagnosis not present

## 2019-01-09 DIAGNOSIS — R5383 Other fatigue: Secondary | ICD-10-CM | POA: Diagnosis not present

## 2019-01-09 DIAGNOSIS — I10 Essential (primary) hypertension: Secondary | ICD-10-CM | POA: Diagnosis not present

## 2019-01-09 DIAGNOSIS — Z79899 Other long term (current) drug therapy: Secondary | ICD-10-CM | POA: Diagnosis not present

## 2019-01-09 DIAGNOSIS — I444 Left anterior fascicular block: Secondary | ICD-10-CM | POA: Diagnosis not present

## 2019-01-09 DIAGNOSIS — T7840XD Allergy, unspecified, subsequent encounter: Secondary | ICD-10-CM | POA: Diagnosis not present

## 2019-01-09 DIAGNOSIS — R9431 Abnormal electrocardiogram [ECG] [EKG]: Secondary | ICD-10-CM | POA: Diagnosis not present

## 2019-01-09 DIAGNOSIS — C562 Malignant neoplasm of left ovary: Secondary | ICD-10-CM | POA: Diagnosis not present

## 2019-01-16 DIAGNOSIS — C786 Secondary malignant neoplasm of retroperitoneum and peritoneum: Secondary | ICD-10-CM | POA: Diagnosis not present

## 2019-01-16 DIAGNOSIS — R5383 Other fatigue: Secondary | ICD-10-CM | POA: Diagnosis not present

## 2019-01-16 DIAGNOSIS — C562 Malignant neoplasm of left ovary: Secondary | ICD-10-CM | POA: Diagnosis not present

## 2019-01-16 DIAGNOSIS — I2699 Other pulmonary embolism without acute cor pulmonale: Secondary | ICD-10-CM | POA: Diagnosis not present

## 2019-01-16 DIAGNOSIS — C5702 Malignant neoplasm of left fallopian tube: Secondary | ICD-10-CM | POA: Diagnosis not present

## 2019-01-16 DIAGNOSIS — T451X5A Adverse effect of antineoplastic and immunosuppressive drugs, initial encounter: Secondary | ICD-10-CM | POA: Diagnosis not present

## 2019-01-16 DIAGNOSIS — I10 Essential (primary) hypertension: Secondary | ICD-10-CM | POA: Diagnosis not present

## 2019-01-16 DIAGNOSIS — T7840XD Allergy, unspecified, subsequent encounter: Secondary | ICD-10-CM | POA: Diagnosis not present

## 2019-01-16 DIAGNOSIS — G629 Polyneuropathy, unspecified: Secondary | ICD-10-CM | POA: Diagnosis not present

## 2019-01-16 DIAGNOSIS — Z006 Encounter for examination for normal comparison and control in clinical research program: Secondary | ICD-10-CM | POA: Diagnosis not present

## 2019-01-16 DIAGNOSIS — Z5111 Encounter for antineoplastic chemotherapy: Secondary | ICD-10-CM | POA: Diagnosis not present

## 2019-01-16 DIAGNOSIS — F419 Anxiety disorder, unspecified: Secondary | ICD-10-CM | POA: Diagnosis not present

## 2019-01-16 DIAGNOSIS — R112 Nausea with vomiting, unspecified: Secondary | ICD-10-CM | POA: Diagnosis not present

## 2019-01-16 DIAGNOSIS — C561 Malignant neoplasm of right ovary: Secondary | ICD-10-CM | POA: Diagnosis not present

## 2019-01-16 DIAGNOSIS — Z5112 Encounter for antineoplastic immunotherapy: Secondary | ICD-10-CM | POA: Diagnosis not present

## 2019-01-23 DIAGNOSIS — T451X5A Adverse effect of antineoplastic and immunosuppressive drugs, initial encounter: Secondary | ICD-10-CM | POA: Diagnosis not present

## 2019-01-23 DIAGNOSIS — R112 Nausea with vomiting, unspecified: Secondary | ICD-10-CM | POA: Diagnosis not present

## 2019-01-23 DIAGNOSIS — Z79899 Other long term (current) drug therapy: Secondary | ICD-10-CM | POA: Diagnosis not present

## 2019-01-23 DIAGNOSIS — Z5111 Encounter for antineoplastic chemotherapy: Secondary | ICD-10-CM | POA: Diagnosis not present

## 2019-01-23 DIAGNOSIS — Z006 Encounter for examination for normal comparison and control in clinical research program: Secondary | ICD-10-CM | POA: Diagnosis not present

## 2019-01-23 DIAGNOSIS — C562 Malignant neoplasm of left ovary: Secondary | ICD-10-CM | POA: Diagnosis not present

## 2019-01-30 DIAGNOSIS — T7840XD Allergy, unspecified, subsequent encounter: Secondary | ICD-10-CM | POA: Diagnosis not present

## 2019-01-30 DIAGNOSIS — T451X5A Adverse effect of antineoplastic and immunosuppressive drugs, initial encounter: Secondary | ICD-10-CM | POA: Diagnosis not present

## 2019-01-30 DIAGNOSIS — Z79899 Other long term (current) drug therapy: Secondary | ICD-10-CM | POA: Diagnosis not present

## 2019-01-30 DIAGNOSIS — X58XXXD Exposure to other specified factors, subsequent encounter: Secondary | ICD-10-CM | POA: Diagnosis not present

## 2019-01-30 DIAGNOSIS — R112 Nausea with vomiting, unspecified: Secondary | ICD-10-CM | POA: Diagnosis not present

## 2019-01-30 DIAGNOSIS — Z006 Encounter for examination for normal comparison and control in clinical research program: Secondary | ICD-10-CM | POA: Diagnosis not present

## 2019-01-30 DIAGNOSIS — C562 Malignant neoplasm of left ovary: Secondary | ICD-10-CM | POA: Diagnosis not present

## 2019-02-01 ENCOUNTER — Telehealth: Payer: Self-pay

## 2019-02-01 DIAGNOSIS — Z006 Encounter for examination for normal comparison and control in clinical research program: Secondary | ICD-10-CM

## 2019-02-01 NOTE — Telephone Encounter (Signed)
Voicemail left with Ms. Veley with the time of 1030 for her labs that are expected on 10/26.

## 2019-02-05 ENCOUNTER — Other Ambulatory Visit: Payer: Self-pay

## 2019-02-05 ENCOUNTER — Inpatient Hospital Stay: Payer: Federal, State, Local not specified - PPO | Attending: Obstetrics and Gynecology

## 2019-02-05 DIAGNOSIS — M545 Low back pain: Secondary | ICD-10-CM | POA: Insufficient documentation

## 2019-02-05 DIAGNOSIS — Z87891 Personal history of nicotine dependence: Secondary | ICD-10-CM | POA: Insufficient documentation

## 2019-02-05 DIAGNOSIS — Z006 Encounter for examination for normal comparison and control in clinical research program: Secondary | ICD-10-CM

## 2019-02-05 DIAGNOSIS — R6881 Early satiety: Secondary | ICD-10-CM | POA: Insufficient documentation

## 2019-02-05 DIAGNOSIS — R1900 Intra-abdominal and pelvic swelling, mass and lump, unspecified site: Secondary | ICD-10-CM | POA: Insufficient documentation

## 2019-02-05 DIAGNOSIS — R971 Elevated cancer antigen 125 [CA 125]: Secondary | ICD-10-CM | POA: Insufficient documentation

## 2019-02-05 LAB — CBC WITH DIFFERENTIAL/PLATELET
Abs Immature Granulocytes: 0.07 10*3/uL (ref 0.00–0.07)
Basophils Absolute: 0 10*3/uL (ref 0.0–0.1)
Basophils Relative: 1 %
Eosinophils Absolute: 0 10*3/uL (ref 0.0–0.5)
Eosinophils Relative: 1 %
HCT: 35.7 % — ABNORMAL LOW (ref 36.0–46.0)
Hemoglobin: 11.5 g/dL — ABNORMAL LOW (ref 12.0–15.0)
Immature Granulocytes: 2 %
Lymphocytes Relative: 21 %
Lymphs Abs: 0.7 10*3/uL (ref 0.7–4.0)
MCH: 28.7 pg (ref 26.0–34.0)
MCHC: 32.2 g/dL (ref 30.0–36.0)
MCV: 89 fL (ref 80.0–100.0)
Monocytes Absolute: 0.6 10*3/uL (ref 0.1–1.0)
Monocytes Relative: 18 %
Neutro Abs: 2 10*3/uL (ref 1.7–7.7)
Neutrophils Relative %: 57 %
Platelets: 227 10*3/uL (ref 150–400)
RBC: 4.01 MIL/uL (ref 3.87–5.11)
RDW: 18.2 % — ABNORMAL HIGH (ref 11.5–15.5)
WBC: 3.5 10*3/uL — ABNORMAL LOW (ref 4.0–10.5)
nRBC: 0 % (ref 0.0–0.2)

## 2019-02-06 DIAGNOSIS — Z006 Encounter for examination for normal comparison and control in clinical research program: Secondary | ICD-10-CM | POA: Diagnosis not present

## 2019-02-06 DIAGNOSIS — C562 Malignant neoplasm of left ovary: Secondary | ICD-10-CM | POA: Diagnosis not present

## 2019-02-06 DIAGNOSIS — T451X5A Adverse effect of antineoplastic and immunosuppressive drugs, initial encounter: Secondary | ICD-10-CM | POA: Diagnosis not present

## 2019-02-06 DIAGNOSIS — Z5111 Encounter for antineoplastic chemotherapy: Secondary | ICD-10-CM | POA: Diagnosis not present

## 2019-02-06 DIAGNOSIS — R112 Nausea with vomiting, unspecified: Secondary | ICD-10-CM | POA: Diagnosis not present

## 2019-02-06 DIAGNOSIS — X58XXXA Exposure to other specified factors, initial encounter: Secondary | ICD-10-CM | POA: Diagnosis not present

## 2019-02-06 DIAGNOSIS — Z79899 Other long term (current) drug therapy: Secondary | ICD-10-CM | POA: Diagnosis not present

## 2019-02-06 DIAGNOSIS — T7840XD Allergy, unspecified, subsequent encounter: Secondary | ICD-10-CM | POA: Diagnosis not present

## 2019-02-12 DIAGNOSIS — Z9889 Other specified postprocedural states: Secondary | ICD-10-CM | POA: Diagnosis not present

## 2019-02-12 DIAGNOSIS — Z79899 Other long term (current) drug therapy: Secondary | ICD-10-CM | POA: Diagnosis not present

## 2019-02-12 DIAGNOSIS — Z90710 Acquired absence of both cervix and uterus: Secondary | ICD-10-CM | POA: Diagnosis not present

## 2019-02-12 DIAGNOSIS — Z90722 Acquired absence of ovaries, bilateral: Secondary | ICD-10-CM | POA: Diagnosis not present

## 2019-02-12 DIAGNOSIS — C562 Malignant neoplasm of left ovary: Secondary | ICD-10-CM | POA: Diagnosis not present

## 2019-02-12 DIAGNOSIS — Z006 Encounter for examination for normal comparison and control in clinical research program: Secondary | ICD-10-CM | POA: Diagnosis not present

## 2019-02-12 DIAGNOSIS — R188 Other ascites: Secondary | ICD-10-CM | POA: Diagnosis not present

## 2019-02-13 DIAGNOSIS — Z20828 Contact with and (suspected) exposure to other viral communicable diseases: Secondary | ICD-10-CM | POA: Diagnosis not present

## 2019-02-13 DIAGNOSIS — T451X5A Adverse effect of antineoplastic and immunosuppressive drugs, initial encounter: Secondary | ICD-10-CM | POA: Diagnosis not present

## 2019-02-13 DIAGNOSIS — G629 Polyneuropathy, unspecified: Secondary | ICD-10-CM | POA: Diagnosis not present

## 2019-02-13 DIAGNOSIS — Z006 Encounter for examination for normal comparison and control in clinical research program: Secondary | ICD-10-CM | POA: Diagnosis not present

## 2019-02-13 DIAGNOSIS — I2699 Other pulmonary embolism without acute cor pulmonale: Secondary | ICD-10-CM | POA: Diagnosis not present

## 2019-02-13 DIAGNOSIS — Z6841 Body Mass Index (BMI) 40.0 and over, adult: Secondary | ICD-10-CM | POA: Diagnosis not present

## 2019-02-13 DIAGNOSIS — C562 Malignant neoplasm of left ovary: Secondary | ICD-10-CM | POA: Diagnosis not present

## 2019-02-13 DIAGNOSIS — R188 Other ascites: Secondary | ICD-10-CM | POA: Diagnosis not present

## 2019-02-13 DIAGNOSIS — I10 Essential (primary) hypertension: Secondary | ICD-10-CM | POA: Diagnosis not present

## 2019-02-13 DIAGNOSIS — Z5111 Encounter for antineoplastic chemotherapy: Secondary | ICD-10-CM | POA: Diagnosis not present

## 2019-02-13 DIAGNOSIS — F411 Generalized anxiety disorder: Secondary | ICD-10-CM | POA: Diagnosis not present

## 2019-02-13 DIAGNOSIS — R590 Localized enlarged lymph nodes: Secondary | ICD-10-CM | POA: Diagnosis not present

## 2019-02-13 DIAGNOSIS — R000 Tachycardia, unspecified: Secondary | ICD-10-CM | POA: Diagnosis not present

## 2019-02-13 DIAGNOSIS — R5081 Fever presenting with conditions classified elsewhere: Secondary | ICD-10-CM | POA: Diagnosis not present

## 2019-02-13 DIAGNOSIS — R112 Nausea with vomiting, unspecified: Secondary | ICD-10-CM | POA: Diagnosis not present

## 2019-02-20 DIAGNOSIS — X58XXXA Exposure to other specified factors, initial encounter: Secondary | ICD-10-CM | POA: Diagnosis not present

## 2019-02-20 DIAGNOSIS — Z006 Encounter for examination for normal comparison and control in clinical research program: Secondary | ICD-10-CM | POA: Diagnosis not present

## 2019-02-20 DIAGNOSIS — R112 Nausea with vomiting, unspecified: Secondary | ICD-10-CM | POA: Diagnosis not present

## 2019-02-20 DIAGNOSIS — T7840XD Allergy, unspecified, subsequent encounter: Secondary | ICD-10-CM | POA: Diagnosis not present

## 2019-02-20 DIAGNOSIS — Z5111 Encounter for antineoplastic chemotherapy: Secondary | ICD-10-CM | POA: Diagnosis not present

## 2019-02-20 DIAGNOSIS — T451X5A Adverse effect of antineoplastic and immunosuppressive drugs, initial encounter: Secondary | ICD-10-CM | POA: Diagnosis not present

## 2019-02-20 DIAGNOSIS — C562 Malignant neoplasm of left ovary: Secondary | ICD-10-CM | POA: Diagnosis not present

## 2019-02-21 DIAGNOSIS — Z79899 Other long term (current) drug therapy: Secondary | ICD-10-CM | POA: Diagnosis not present

## 2019-02-21 DIAGNOSIS — C786 Secondary malignant neoplasm of retroperitoneum and peritoneum: Secondary | ICD-10-CM | POA: Diagnosis not present

## 2019-02-21 DIAGNOSIS — I81 Portal vein thrombosis: Secondary | ICD-10-CM | POA: Diagnosis not present

## 2019-02-21 DIAGNOSIS — C562 Malignant neoplasm of left ovary: Secondary | ICD-10-CM | POA: Diagnosis not present

## 2019-02-27 DIAGNOSIS — T451X5A Adverse effect of antineoplastic and immunosuppressive drugs, initial encounter: Secondary | ICD-10-CM | POA: Diagnosis not present

## 2019-02-27 DIAGNOSIS — Z79899 Other long term (current) drug therapy: Secondary | ICD-10-CM | POA: Diagnosis not present

## 2019-02-27 DIAGNOSIS — R112 Nausea with vomiting, unspecified: Secondary | ICD-10-CM | POA: Diagnosis not present

## 2019-02-27 DIAGNOSIS — C562 Malignant neoplasm of left ovary: Secondary | ICD-10-CM | POA: Diagnosis not present

## 2019-02-27 DIAGNOSIS — T7840XD Allergy, unspecified, subsequent encounter: Secondary | ICD-10-CM | POA: Diagnosis not present

## 2019-02-27 DIAGNOSIS — Z006 Encounter for examination for normal comparison and control in clinical research program: Secondary | ICD-10-CM | POA: Diagnosis not present

## 2019-02-27 DIAGNOSIS — X58XXXD Exposure to other specified factors, subsequent encounter: Secondary | ICD-10-CM | POA: Diagnosis not present

## 2019-02-27 DIAGNOSIS — R180 Malignant ascites: Secondary | ICD-10-CM | POA: Diagnosis not present

## 2019-03-02 DIAGNOSIS — R180 Malignant ascites: Secondary | ICD-10-CM | POA: Diagnosis not present

## 2019-03-02 DIAGNOSIS — G893 Neoplasm related pain (acute) (chronic): Secondary | ICD-10-CM | POA: Diagnosis not present

## 2019-03-02 DIAGNOSIS — I2699 Other pulmonary embolism without acute cor pulmonale: Secondary | ICD-10-CM | POA: Diagnosis not present

## 2019-03-02 DIAGNOSIS — C562 Malignant neoplasm of left ovary: Secondary | ICD-10-CM | POA: Diagnosis not present

## 2019-03-05 DIAGNOSIS — C786 Secondary malignant neoplasm of retroperitoneum and peritoneum: Secondary | ICD-10-CM | POA: Diagnosis not present

## 2019-03-05 DIAGNOSIS — R180 Malignant ascites: Secondary | ICD-10-CM | POA: Diagnosis not present

## 2019-03-05 DIAGNOSIS — I2699 Other pulmonary embolism without acute cor pulmonale: Secondary | ICD-10-CM | POA: Diagnosis not present

## 2019-03-05 DIAGNOSIS — G893 Neoplasm related pain (acute) (chronic): Secondary | ICD-10-CM | POA: Diagnosis not present

## 2019-03-05 DIAGNOSIS — Z7901 Long term (current) use of anticoagulants: Secondary | ICD-10-CM | POA: Diagnosis not present

## 2019-03-05 DIAGNOSIS — Z438 Encounter for attention to other artificial openings: Secondary | ICD-10-CM | POA: Diagnosis not present

## 2019-03-05 DIAGNOSIS — Z6841 Body Mass Index (BMI) 40.0 and over, adult: Secondary | ICD-10-CM | POA: Diagnosis not present

## 2019-03-05 DIAGNOSIS — C562 Malignant neoplasm of left ovary: Secondary | ICD-10-CM | POA: Diagnosis not present

## 2019-03-06 DIAGNOSIS — I2699 Other pulmonary embolism without acute cor pulmonale: Secondary | ICD-10-CM | POA: Diagnosis not present

## 2019-03-06 DIAGNOSIS — Z7901 Long term (current) use of anticoagulants: Secondary | ICD-10-CM | POA: Diagnosis not present

## 2019-03-06 DIAGNOSIS — G893 Neoplasm related pain (acute) (chronic): Secondary | ICD-10-CM | POA: Diagnosis not present

## 2019-03-06 DIAGNOSIS — R180 Malignant ascites: Secondary | ICD-10-CM | POA: Diagnosis not present

## 2019-03-06 DIAGNOSIS — Z6841 Body Mass Index (BMI) 40.0 and over, adult: Secondary | ICD-10-CM | POA: Diagnosis not present

## 2019-03-06 DIAGNOSIS — C786 Secondary malignant neoplasm of retroperitoneum and peritoneum: Secondary | ICD-10-CM | POA: Diagnosis not present

## 2019-03-06 DIAGNOSIS — Z438 Encounter for attention to other artificial openings: Secondary | ICD-10-CM | POA: Diagnosis not present

## 2019-03-06 DIAGNOSIS — C562 Malignant neoplasm of left ovary: Secondary | ICD-10-CM | POA: Diagnosis not present

## 2019-03-12 DIAGNOSIS — Z5111 Encounter for antineoplastic chemotherapy: Secondary | ICD-10-CM | POA: Diagnosis not present

## 2019-03-12 DIAGNOSIS — R180 Malignant ascites: Secondary | ICD-10-CM | POA: Diagnosis not present

## 2019-03-12 DIAGNOSIS — K219 Gastro-esophageal reflux disease without esophagitis: Secondary | ICD-10-CM | POA: Diagnosis not present

## 2019-03-12 DIAGNOSIS — C569 Malignant neoplasm of unspecified ovary: Secondary | ICD-10-CM | POA: Diagnosis not present

## 2019-03-13 DIAGNOSIS — C562 Malignant neoplasm of left ovary: Secondary | ICD-10-CM | POA: Diagnosis not present

## 2019-03-13 DIAGNOSIS — Z5112 Encounter for antineoplastic immunotherapy: Secondary | ICD-10-CM | POA: Diagnosis not present

## 2019-03-27 DIAGNOSIS — C562 Malignant neoplasm of left ovary: Secondary | ICD-10-CM | POA: Diagnosis not present

## 2019-03-27 DIAGNOSIS — K56609 Unspecified intestinal obstruction, unspecified as to partial versus complete obstruction: Secondary | ICD-10-CM | POA: Diagnosis not present

## 2019-03-27 DIAGNOSIS — R180 Malignant ascites: Secondary | ICD-10-CM | POA: Diagnosis not present

## 2019-03-30 DIAGNOSIS — R82998 Other abnormal findings in urine: Secondary | ICD-10-CM | POA: Diagnosis not present

## 2019-03-30 DIAGNOSIS — K219 Gastro-esophageal reflux disease without esophagitis: Secondary | ICD-10-CM | POA: Diagnosis not present

## 2019-03-30 DIAGNOSIS — R000 Tachycardia, unspecified: Secondary | ICD-10-CM | POA: Diagnosis not present

## 2019-03-30 DIAGNOSIS — E860 Dehydration: Secondary | ICD-10-CM | POA: Diagnosis not present

## 2019-03-30 DIAGNOSIS — Z79899 Other long term (current) drug therapy: Secondary | ICD-10-CM | POA: Diagnosis not present

## 2019-03-30 DIAGNOSIS — C786 Secondary malignant neoplasm of retroperitoneum and peritoneum: Secondary | ICD-10-CM | POA: Diagnosis not present

## 2019-03-30 DIAGNOSIS — I10 Essential (primary) hypertension: Secondary | ICD-10-CM | POA: Diagnosis not present

## 2019-03-30 DIAGNOSIS — Z87891 Personal history of nicotine dependence: Secondary | ICD-10-CM | POA: Diagnosis not present

## 2019-03-30 DIAGNOSIS — Z7901 Long term (current) use of anticoagulants: Secondary | ICD-10-CM | POA: Diagnosis not present

## 2019-03-30 DIAGNOSIS — Z20828 Contact with and (suspected) exposure to other viral communicable diseases: Secondary | ICD-10-CM | POA: Diagnosis not present

## 2019-03-30 DIAGNOSIS — Z5181 Encounter for therapeutic drug level monitoring: Secondary | ICD-10-CM | POA: Diagnosis not present

## 2019-03-30 DIAGNOSIS — C562 Malignant neoplasm of left ovary: Secondary | ICD-10-CM | POA: Diagnosis not present

## 2019-03-30 DIAGNOSIS — R112 Nausea with vomiting, unspecified: Secondary | ICD-10-CM | POA: Diagnosis not present

## 2019-04-03 DIAGNOSIS — R112 Nausea with vomiting, unspecified: Secondary | ICD-10-CM | POA: Diagnosis not present

## 2019-04-03 DIAGNOSIS — C562 Malignant neoplasm of left ovary: Secondary | ICD-10-CM | POA: Diagnosis not present

## 2019-04-03 DIAGNOSIS — K2100 Gastro-esophageal reflux disease with esophagitis, without bleeding: Secondary | ICD-10-CM | POA: Diagnosis not present

## 2019-04-09 DIAGNOSIS — C569 Malignant neoplasm of unspecified ovary: Secondary | ICD-10-CM | POA: Diagnosis not present

## 2019-04-09 DIAGNOSIS — K219 Gastro-esophageal reflux disease without esophagitis: Secondary | ICD-10-CM | POA: Diagnosis not present

## 2019-04-09 DIAGNOSIS — R180 Malignant ascites: Secondary | ICD-10-CM | POA: Diagnosis not present

## 2019-04-09 DIAGNOSIS — Z5111 Encounter for antineoplastic chemotherapy: Secondary | ICD-10-CM | POA: Diagnosis not present

## 2019-07-06 ENCOUNTER — Other Ambulatory Visit: Payer: Self-pay | Admitting: Obstetrics and Gynecology

## 2019-07-06 DIAGNOSIS — F32A Depression, unspecified: Secondary | ICD-10-CM

## 2019-07-06 DIAGNOSIS — F419 Anxiety disorder, unspecified: Secondary | ICD-10-CM

## 2019-07-06 DIAGNOSIS — F329 Major depressive disorder, single episode, unspecified: Secondary | ICD-10-CM

## 2019-07-09 ENCOUNTER — Other Ambulatory Visit: Payer: Self-pay | Admitting: Obstetrics and Gynecology

## 2019-07-09 DIAGNOSIS — F419 Anxiety disorder, unspecified: Secondary | ICD-10-CM

## 2019-07-09 DIAGNOSIS — F329 Major depressive disorder, single episode, unspecified: Secondary | ICD-10-CM

## 2019-07-09 DIAGNOSIS — F32A Depression, unspecified: Secondary | ICD-10-CM

## 2019-08-11 DEATH — deceased

## 2020-04-09 IMAGING — CT CT ABD-PELV W/ CM
1 of 3 series · 11 of 32 positions shown, 17 images · IV contrast (iopamidol)
Comparison: MRI pelvis 07/26/2017

CLINICAL DATA: Left ovarian mass, preop

EXAM:
CT CHEST, ABDOMEN, AND PELVIS WITH CONTRAST
TECHNIQUE: Multidetector CT imaging of the chest, abdomen and pelvis was
performed following the standard protocol during bolus
administration of intravenous contrast.
CONTRAST:  125mL XLP21D-QFF IOPAMIDOL (XLP21D-QFF) INJECTION 61%

[Series 2: cap with · axial · 0.73mm/px · z∈[-1048,-523]mm · 11 of 127 slices shown, 17 images]
[im 11/127  soft-tissue]
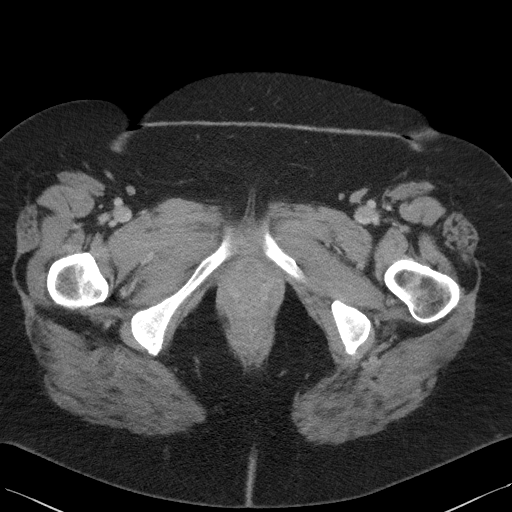
[im 11/127  bone]
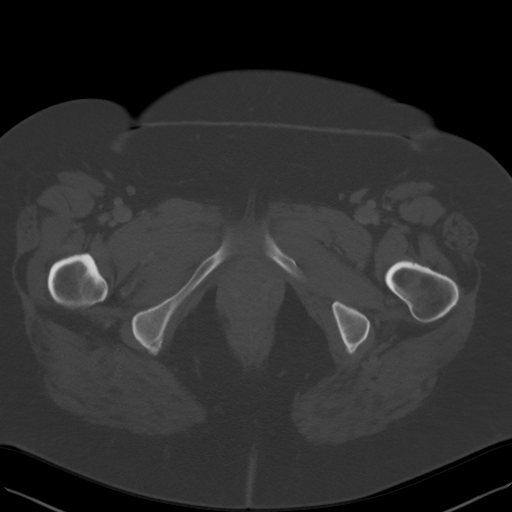
[im 22/127  soft-tissue]
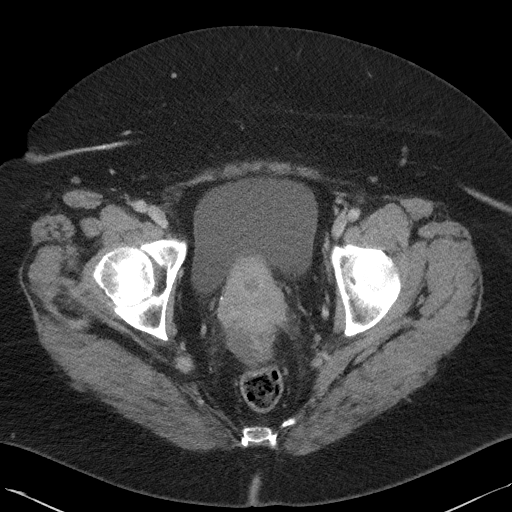
[im 32/127  soft-tissue]
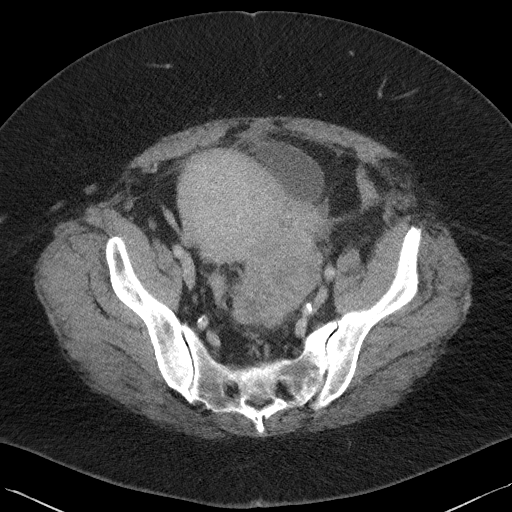
[im 43/127  soft-tissue]
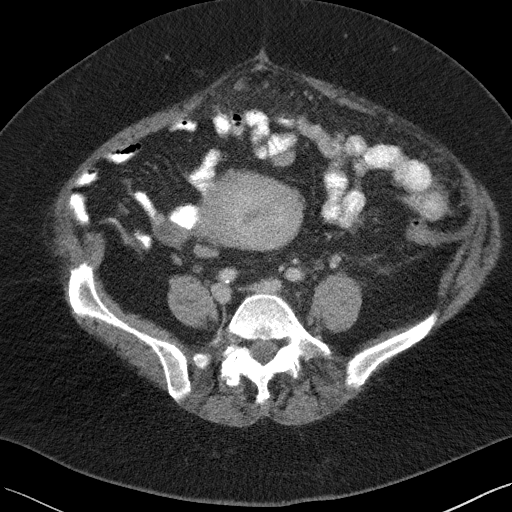
[im 53/127  soft-tissue]
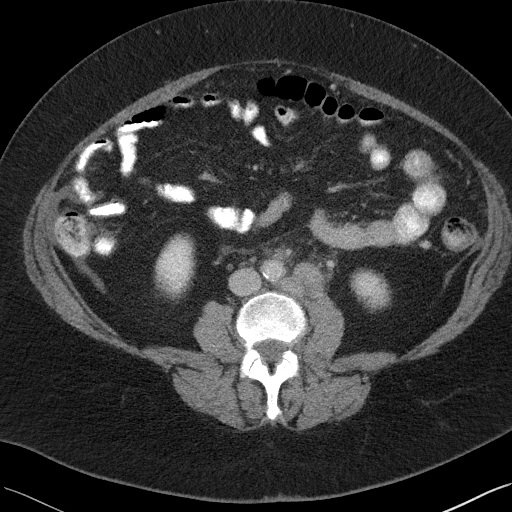
[im 64/127  soft-tissue]
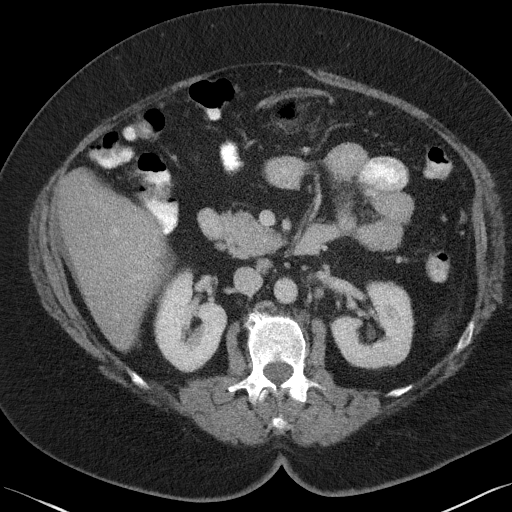
[im 74/127  soft-tissue]
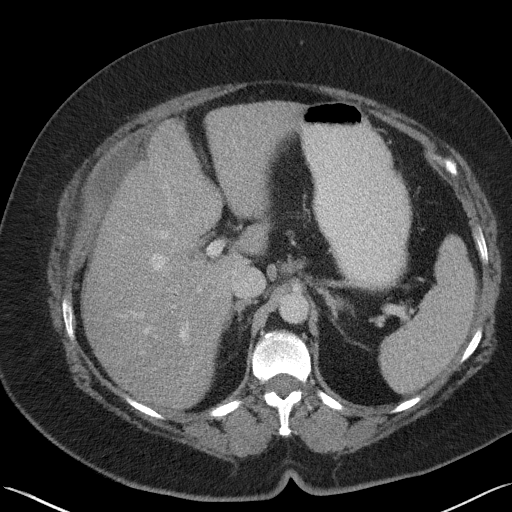
[im 85/127  soft-tissue]
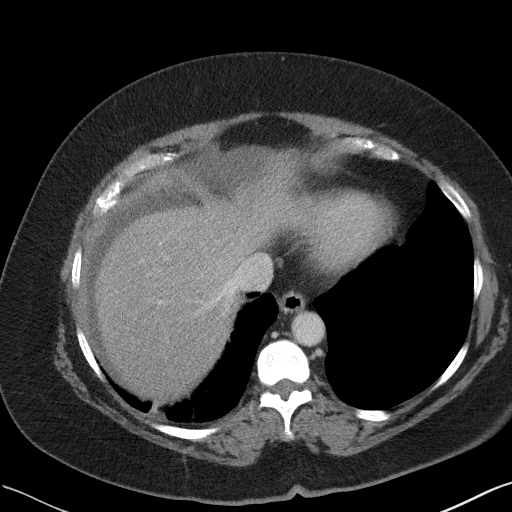
[im 85/127  lung]
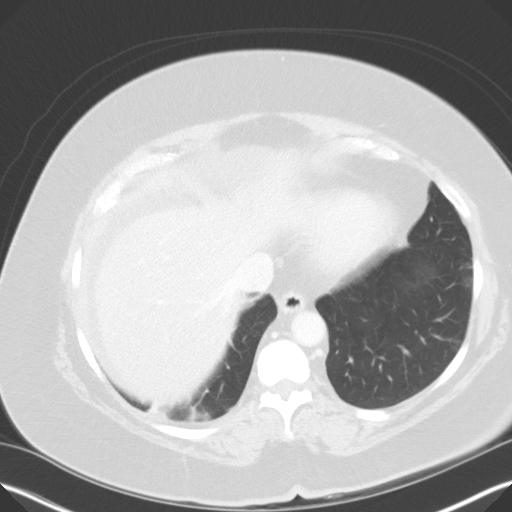
[im 95/127  soft-tissue]
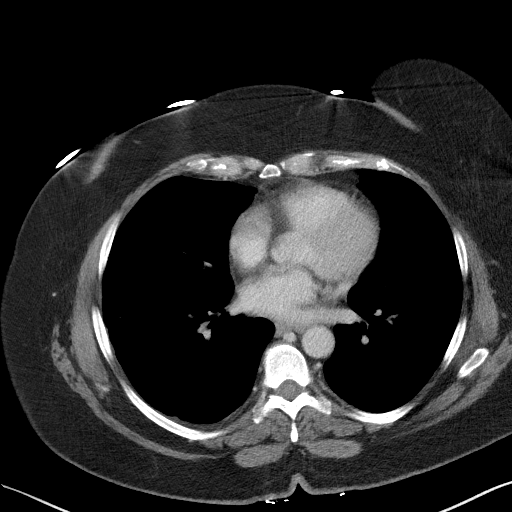
[im 95/127  lung]
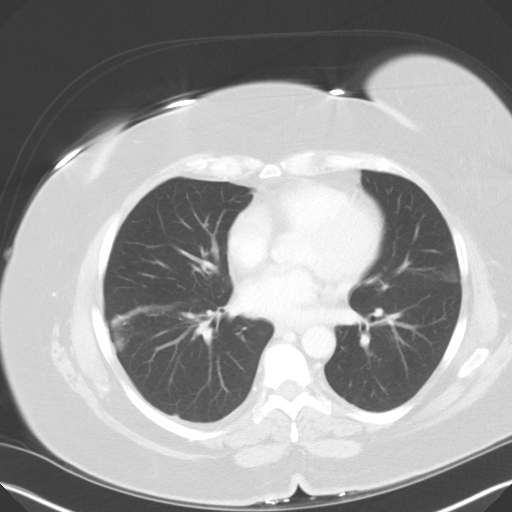
[im 95/127  bone]
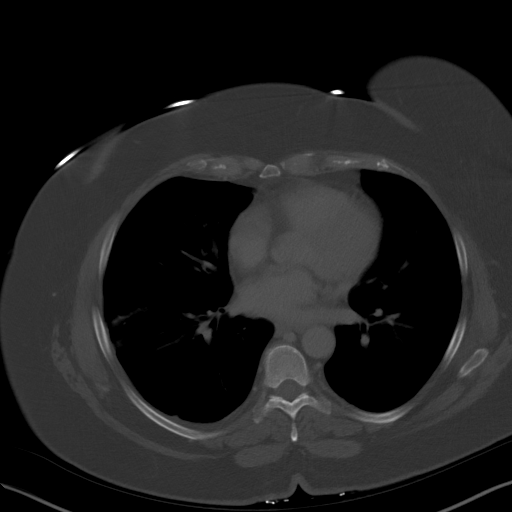
[im 106/127  soft-tissue]
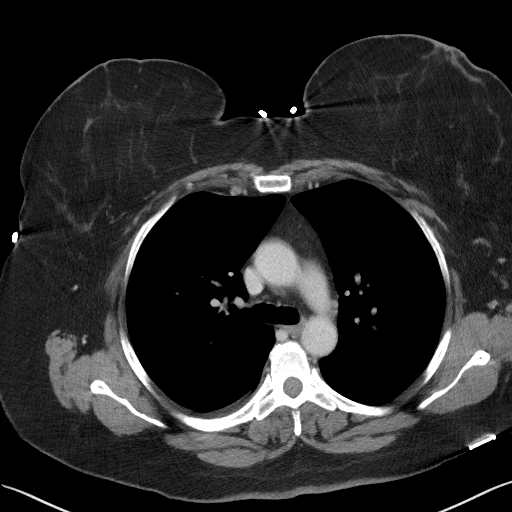
[im 106/127  lung]
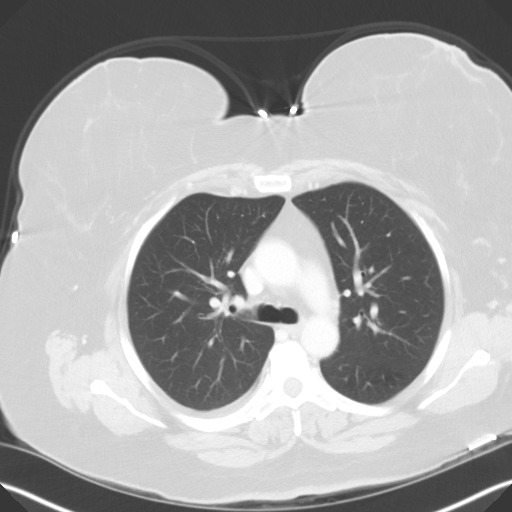
[im 116/127  soft-tissue]
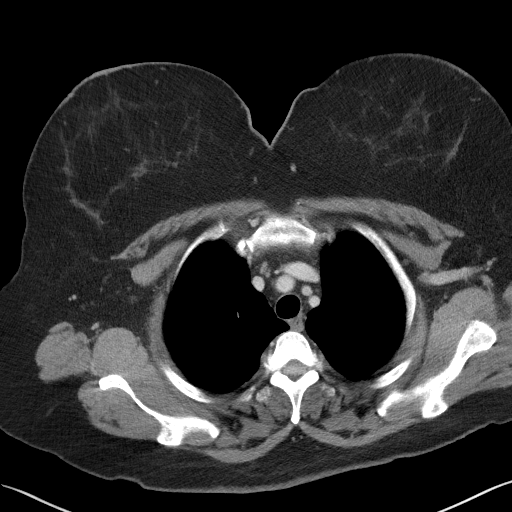
[im 116/127  lung]
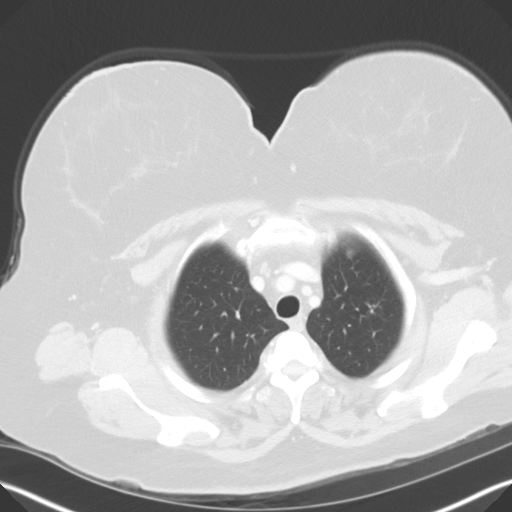

[11 of 32 positions shown; findings below may reference images not displayed]

FINDINGS: CT CHEST FINDINGS

Cardiovascular: Heart is normal size. Aorta is normal caliber.
Scattered aortic calcifications.

Mediastinum/Nodes: No mediastinal, hilar, or axillary adenopathy.

Lungs/Pleura: Linear atelectasis and/or scarring in the lung bases.
No effusions or suspicious pulmonary nodules.

Musculoskeletal: No chest wall mass or suspicious bone lesions
identified.

CT ABDOMEN PELVIS FINDINGS

Hepatobiliary: No focal liver abnormality is seen. Status post
cholecystectomy. No biliary dilatation.

Pancreas: No focal abnormality or ductal dilatation.

Spleen: No focal abnormality.  Normal size.

Adrenals/Urinary Tract: No adrenal abnormality. No focal renal
abnormality. No stones or hydronephrosis. Urinary bladder is
unremarkable.

Stomach/Bowel: Stomach, large and small bowel grossly unremarkable.

Vascular/Lymphatic: Scattered aortic atherosclerosis. No aneurysm.
There is retroperitoneal adenopathy. Conglomerate nodal mass in the
left periaortic region on image 71 measures 2.6 x 2.5 cm. Precaval
lymph node has a short axis diameter of 13 mm on image 63. Upper
abdominal adenopathy noted with celiac axis node having a short axis
diameter of 14 mm on image 52. Left external iliac lymph node has a
short axis diameter of 10 mm on image 102.

Reproductive: Large fibroid anteriorly within the uterus as seen on
prior MRI. Complex cystic and solid mass noted in the pelvis near
the cul-de-sac measuring approximately 9.5 x 6.7 cm as seen on prior
MRI. This is most compatible with ovarian mass/cancer.

Other: There is free fluid adjacent to the liver and spleen.
Nodularity noted anteriorly in the left and central abdomen and
upper pelvis compatible with metastatic omental involvement.

Musculoskeletal: No acute bony abnormality.
IMPRESSION: 9.5 x 6.7 cm mass in the pelvis most compatible with ovarian cancer
as seen on prior MRI. This is associated with omental caking,
retroperitoneal adenopathy, and small amount of perihepatic and
perisplenic ascites.

No evidence of metastatic disease in the chest. Bibasilar
atelectasis or scarring.
